# Patient Record
Sex: Female | Born: 1985 | Race: Black or African American | Hispanic: No | Marital: Single | Smoking: Former smoker
Health system: Southern US, Community
[De-identification: ages and names within clinical notes are randomized; demographics above are authoritative.]

## PROBLEM LIST (undated history)

## (undated) DIAGNOSIS — F1010 Alcohol abuse, uncomplicated: Secondary | ICD-10-CM

## (undated) DIAGNOSIS — F4310 Post-traumatic stress disorder, unspecified: Secondary | ICD-10-CM

## (undated) DIAGNOSIS — F419 Anxiety disorder, unspecified: Secondary | ICD-10-CM

## (undated) DIAGNOSIS — Z789 Other specified health status: Secondary | ICD-10-CM

---

## 2008-01-29 ENCOUNTER — Emergency Department (HOSPITAL_COMMUNITY): Admission: EM | Admit: 2008-01-29 | Discharge: 2008-01-29 | Payer: Self-pay | Admitting: Emergency Medicine

## 2008-02-04 ENCOUNTER — Emergency Department (HOSPITAL_COMMUNITY): Admission: EM | Admit: 2008-02-04 | Discharge: 2008-02-04 | Payer: Self-pay | Admitting: Emergency Medicine

## 2009-07-03 ENCOUNTER — Other Ambulatory Visit: Admission: RE | Admit: 2009-07-03 | Discharge: 2009-07-03 | Payer: Self-pay | Admitting: Obstetrics and Gynecology

## 2011-06-30 ENCOUNTER — Other Ambulatory Visit (HOSPITAL_COMMUNITY)
Admission: RE | Admit: 2011-06-30 | Discharge: 2011-06-30 | Disposition: A | Discharge status: Home / Self Care | Payer: 59 | Source: Ambulatory Visit | Attending: Obstetrics and Gynecology | Admitting: Obstetrics and Gynecology

## 2011-06-30 ENCOUNTER — Other Ambulatory Visit: Payer: Self-pay | Admitting: Nurse Practitioner

## 2011-06-30 DIAGNOSIS — Z01419 Encounter for gynecological examination (general) (routine) without abnormal findings: Secondary | ICD-10-CM | POA: Insufficient documentation

## 2012-02-08 ENCOUNTER — Ambulatory Visit (INDEPENDENT_AMBULATORY_CARE_PROVIDER_SITE_OTHER): Payer: BC Managed Care – PPO | Admitting: Emergency Medicine

## 2012-02-08 VITALS — BP 106/74 | HR 93 | Temp 98.6°F | Resp 16 | Ht 63.5 in | Wt 160.0 lb

## 2012-02-08 DIAGNOSIS — N760 Acute vaginitis: Secondary | ICD-10-CM

## 2012-02-08 DIAGNOSIS — A499 Bacterial infection, unspecified: Secondary | ICD-10-CM

## 2012-02-08 DIAGNOSIS — B9689 Other specified bacterial agents as the cause of diseases classified elsewhere: Secondary | ICD-10-CM

## 2012-02-08 NOTE — Progress Notes (Addendum)
   Date:  02/08/2012   Name:  Tara Yang   DOB:  28-May-1986   MRN:  [id number] Gender: female Age: 26 y.o.  PCP:  No primary provider on file.    Chief Complaint: Vaginal Itching   History of Present Illness:  Tara Yang is a 25 y.o. pleasant patient who presents with the following:  Had GYN annual last week and put on flagyl for BV.  Now to office with concern that she has "bugs"  Infesting her pubic area.  She has no itching and does have a discharge consistent with her history of BV.  No GI or GU symptoms.    There is no problem list on file for this patient.   No past medical history on file.  No past surgical history on file.  History  Substance Use Topics  . Smoking status: Current Some Day Smoker  . Smokeless tobacco: Not on file  . Alcohol Use: Not on file    No family history on file.  No Known Allergies  Medication list has been reviewed and updated.  No current outpatient prescriptions on file prior to visit.    Review of Systems:  As per HPI, otherwise negative.    Physical Examination: Filed Vitals:   02/08/12 1320  BP: 106/74  Pulse: 93  Temp: 98.6 F (37 C)  Resp: 16   Filed Vitals:   02/08/12 1320  Height: 5' 3.5" (1.613 m)  Weight: 160 lb (72.576 kg)   Body mass index is 27.90 kg/(m^2). Ideal Body Weight: Weight in (lb) to have BMI = 25: 143.1    GEN: WDWN, NAD, Non-toxic, Alert & Oriented x 3 HEENT: Atraumatic, Normocephalic.  Ears and Nose: No external deformity. EXTR: No clubbing/cyanosis/edema NEURO: Normal gait.  PSYCH: Normally interactive. Conversant. Not depressed or anxious appearing.  Calm demeanor.  Physical Examination: Pelvic - normal external genitalia, vulva, vagina, cervix, uterus and adnexa   Assessment and Plan: BV Continue flagyl Follow up as needed  Carmelina Dane, MD I have reviewed and agree with documentation. Robert P. Merla Riches, M.D.

## 2014-07-21 ENCOUNTER — Ambulatory Visit (INDEPENDENT_AMBULATORY_CARE_PROVIDER_SITE_OTHER): Payer: 59 | Admitting: Emergency Medicine

## 2014-07-21 VITALS — BP 102/70 | HR 74 | Temp 98.7°F | Resp 18 | Ht 62.0 in | Wt 182.0 lb

## 2014-07-21 DIAGNOSIS — J029 Acute pharyngitis, unspecified: Secondary | ICD-10-CM

## 2014-07-21 DIAGNOSIS — E669 Obesity, unspecified: Secondary | ICD-10-CM

## 2014-07-21 DIAGNOSIS — Z0000 Encounter for general adult medical examination without abnormal findings: Secondary | ICD-10-CM

## 2014-07-21 LAB — POCT URINALYSIS DIPSTICK
BILIRUBIN UA: NEGATIVE
Glucose, UA: NEGATIVE
Ketones, UA: NEGATIVE
LEUKOCYTES UA: NEGATIVE
NITRITE UA: NEGATIVE
PH UA: 6
PROTEIN UA: NEGATIVE
RBC UA: NEGATIVE
Spec Grav, UA: 1.01
UROBILINOGEN UA: 0.2

## 2014-07-21 LAB — COMPREHENSIVE METABOLIC PANEL
ALT: 13 U/L (ref 0–35)
AST: 18 U/L (ref 0–37)
Albumin: 4.3 g/dL (ref 3.5–5.2)
Alkaline Phosphatase: 46 U/L (ref 39–117)
BUN: 9 mg/dL (ref 6–23)
CHLORIDE: 103 meq/L (ref 96–112)
CO2: 24 meq/L (ref 19–32)
CREATININE: 0.66 mg/dL (ref 0.50–1.10)
Calcium: 9.2 mg/dL (ref 8.4–10.5)
Glucose, Bld: 77 mg/dL (ref 70–99)
Potassium: 3.7 mEq/L (ref 3.5–5.3)
SODIUM: 136 meq/L (ref 135–145)
Total Bilirubin: 0.7 mg/dL (ref 0.2–1.2)
Total Protein: 7.7 g/dL (ref 6.0–8.3)

## 2014-07-21 LAB — LIPID PANEL
CHOL/HDL RATIO: 2.4 ratio
CHOLESTEROL: 158 mg/dL (ref 0–200)
HDL: 65 mg/dL (ref >39)
LDL CALC: 82 mg/dL (ref 0–99)
Triglycerides: 56 mg/dL (ref <150)
VLDL: 11 mg/dL (ref 0–40)

## 2014-07-21 LAB — TSH: TSH: 0.762 u[IU]/mL (ref 0.350–4.500)

## 2014-07-21 LAB — POCT RAPID STREP A (OFFICE): RAPID STREP A SCREEN: NEGATIVE

## 2014-07-21 NOTE — Patient Instructions (Signed)

## 2014-07-21 NOTE — Progress Notes (Signed)
[facility] [address], [place] [id number] [phone]  Date:  07/21/2014   Name:  Tara LandauDaphne Yang   DOB:  09/26/1985   MRN:  [id number]  PCP:  No primary care provider on file.    Chief Complaint: Annual Exam and Sore Throat   History of Present Illness:  Tara Yang is a 29 y.o. very pleasant female patient who presents with the following:  Patient comes for a wellness examination Complains of a sore throat. No fever or chills No cough or coryza. No improvement with over the counter medications or other home remedies.  Denies other complaint or health concern today.   There are no active problems to display for this patient.   No past medical history on file.  No past surgical history on file.  History  Substance Use Topics  . Smoking status: Current Some Day Smoker  . Smokeless tobacco: Not on file  . Alcohol Use: Yes    No family history on file.  No Known Allergies  Medication list has been reviewed and updated.  No current outpatient prescriptions on file prior to visit.   No current facility-administered medications on file prior to visit.    Review of Systems:  As per HPI, otherwise negative.    Physical Examination: Filed Vitals:   07/21/14 1249  BP: 102/70  Pulse: 74  Temp: 98.7 F (37.1 C)  Resp: 18   Filed Vitals:   07/21/14 1249  Height: 5\' 2"  (1.575 m)  Weight: 182 lb (82.555 kg)   Body mass index is 33.28 kg/(m^2). Ideal Body Weight: Weight in (lb) to have BMI = 25: 136.4  GEN: WDWN, NAD, Non-toxic, A & O x 3 HEENT: Atraumatic, Normocephalic. Neck supple. No masses, No LAD. Ears and Nose: No external deformity. CV: RRR, No M/G/R. No JVD. No thrill. No extra heart sounds. PULM: CTA B, no wheezes, crackles, rhonchi. No retractions. No resp. distress. No accessory muscle use. ABD: S, NT, ND, +BS. No rebound. No HSM. EXTR: No c/c/e NEURO Normal gait.  PSYCH: Normally interactive. Conversant. Not  depressed or anxious appearing.  Calm demeanor.    Assessment and Plan: Annual exam Pharyngitis TBST  Signed,  Phillips OdorJeffery Ovid Witman, MD   Results for orders placed or performed in visit on 07/21/14  POCT urinalysis dipstick  Result Value Ref Range   Color, UA yellow    Clarity, UA clear    Glucose, UA neg    Bilirubin, UA neg    Ketones, UA neg    Spec Grav, UA 1.010    Blood, UA neg    pH, UA 6.0    Protein, UA neg    Urobilinogen, UA 0.2    Nitrite, UA neg    Leukocytes, UA Negative   POCT rapid strep A  Result Value Ref Range   Rapid Strep A Screen Negative Negative

## 2014-07-22 LAB — CBC WITH DIFFERENTIAL/PLATELET
Basophils Absolute: 0 10*3/uL (ref 0.0–0.1)
Basophils Relative: 0 % (ref 0–1)
EOS PCT: 1 % (ref 0–5)
Eosinophils Absolute: 0.1 10*3/uL (ref 0.0–0.7)
HCT: 37.4 % (ref 36.0–46.0)
HEMOGLOBIN: 12.4 g/dL (ref 12.0–15.0)
LYMPHS PCT: 33 % (ref 12–46)
Lymphs Abs: 2.3 10*3/uL (ref 0.7–4.0)
MCH: 30.7 pg (ref 26.0–34.0)
MCHC: 33.2 g/dL (ref 30.0–36.0)
MCV: 92.6 fL (ref 78.0–100.0)
MONO ABS: 0.4 10*3/uL (ref 0.1–1.0)
MONOS PCT: 5 % (ref 3–12)
MPV: 11.2 fL (ref 8.6–12.4)
NEUTROS ABS: 4.3 10*3/uL (ref 1.7–7.7)
Neutrophils Relative %: 61 % (ref 43–77)
Platelets: 251 10*3/uL (ref 150–400)
RBC: 4.04 MIL/uL (ref 3.87–5.11)
RDW: 13.2 % (ref 11.5–15.5)
WBC: 7 10*3/uL (ref 4.0–10.5)

## 2014-07-23 DIAGNOSIS — Z111 Encounter for screening for respiratory tuberculosis: Secondary | ICD-10-CM

## 2014-07-23 LAB — TB SKIN TEST
INDURATION: 0 mm
TB SKIN TEST: NEGATIVE

## 2014-07-27 ENCOUNTER — Ambulatory Visit (INDEPENDENT_AMBULATORY_CARE_PROVIDER_SITE_OTHER): Payer: 59 | Admitting: Emergency Medicine

## 2014-07-27 VITALS — BP 109/65 | HR 80 | Temp 98.6°F | Resp 17 | Ht 64.0 in | Wt 181.0 lb

## 2014-07-27 DIAGNOSIS — R059 Cough, unspecified: Secondary | ICD-10-CM

## 2014-07-27 DIAGNOSIS — R062 Wheezing: Secondary | ICD-10-CM

## 2014-07-27 DIAGNOSIS — R05 Cough: Secondary | ICD-10-CM

## 2014-07-27 MED ORDER — PREDNISONE 10 MG PO TABS: ORAL_TABLET | ORAL | Status: DC

## 2014-07-27 MED ORDER — BENZONATATE 100 MG PO CAPS: 100.0000 mg | ORAL_CAPSULE | Freq: Three times a day (TID) | ORAL | Status: DC | PRN

## 2014-07-27 MED ORDER — ALBUTEROL SULFATE (2.5 MG/3ML) 0.083% IN NEBU
2.5000 mg | INHALATION_SOLUTION | Freq: Once | RESPIRATORY_TRACT | Status: AC
  Administered 2014-07-27: 2.5 mg via RESPIRATORY_TRACT

## 2014-07-27 NOTE — Patient Instructions (Signed)
Cough, Adult  A cough is a reflex that helps clear your throat and airways. It can help heal the body or may be a reaction to an irritated airway. A cough may only last 2 or 3 weeks (acute) or may last more than 8 weeks (chronic).  CAUSES Acute cough:  Viral or bacterial infections. Chronic cough:  Infections.  Allergies.  Asthma.  Post-nasal drip.  Smoking.  Heartburn or acid reflux.  Some medicines.  Chronic lung problems (COPD).  Cancer. SYMPTOMS   Cough.  Fever.  Chest pain.  Increased breathing rate.  High-pitched whistling sound when breathing (wheezing).  Colored mucus that you cough up (sputum). TREATMENT   A bacterial cough may be treated with antibiotic medicine.  A viral cough must run its course and will not respond to antibiotics.  Your caregiver may recommend other treatments if you have a chronic cough. HOME CARE INSTRUCTIONS   Only take over-the-counter or prescription medicines for pain, discomfort, or fever as directed by your caregiver. Use cough suppressants only as directed by your caregiver.  Use a cold steam vaporizer or humidifier in your bedroom or home to help loosen secretions.  Sleep in a semi-upright position if your cough is worse at night.  Rest as needed.  Stop smoking if you smoke. SEEK IMMEDIATE MEDICAL CARE IF:   You have pus in your sputum.  Your cough starts to worsen.  You cannot control your cough with suppressants and are losing sleep.  You begin coughing up blood.  You have difficulty breathing.  You develop pain which is getting worse or is uncontrolled with medicine.  You have a fever. MAKE SURE YOU:   Understand these instructions.  Will watch your condition.  Will get help right away if you are not doing well or get worse. Document Released: 11/14/2010 Document Revised: 08/10/2011 Document Reviewed: 11/14/2010 ExitCare Patient Information 2015 [employer]. This information is not intended  to replace advice given to you by your health care provider. Make sure you discuss any questions you have with your health care provider. Allergic Rhinitis Allergic rhinitis is when the mucous membranes in the nose respond to allergens. Allergens are particles in the air that cause your body to have an allergic reaction. This causes you to release allergic antibodies. Through a chain of events, these eventually cause you to release histamine into the blood stream. Although meant to protect the body, it is this release of histamine that causes your discomfort, such as frequent sneezing, congestion, and an itchy, runny nose.  CAUSES  Seasonal allergic rhinitis (hay fever) is caused by pollen allergens that may come from grasses, trees, and weeds. Year-round allergic rhinitis (perennial allergic rhinitis) is caused by allergens such as house dust mites, pet dander, and mold spores.  SYMPTOMS   Nasal stuffiness (congestion).  Itchy, runny nose with sneezing and tearing of the eyes. DIAGNOSIS  Your health care provider can help you determine the allergen or allergens that trigger your symptoms. If you and your health care provider are unable to determine the allergen, skin or blood testing may be used. TREATMENT  Allergic rhinitis does not have a cure, but it can be controlled by:  Medicines and allergy shots (immunotherapy).  Avoiding the allergen. Hay fever may often be treated with antihistamines in pill or nasal spray forms. Antihistamines block the effects of histamine. There are over-the-counter medicines that may help with nasal congestion and swelling around the eyes. Check with your health care provider before taking or   giving this medicine.  If avoiding the allergen or the medicine prescribed do not work, there are many new medicines your health care provider can prescribe. Stronger medicine may be used if initial measures are ineffective. Desensitizing injections can be used if medicine and  avoidance does not work. Desensitization is when a patient is given ongoing shots until the body becomes less sensitive to the allergen. Make sure you follow up with your health care provider if problems continue. HOME CARE INSTRUCTIONS It is not possible to completely avoid allergens, but you can reduce your symptoms by taking steps to limit your exposure to them. It helps to know exactly what you are allergic to so that you can avoid your specific triggers. SEEK MEDICAL CARE IF:   You have a fever.  You develop a cough that does not stop easily (persistent).  You have shortness of breath.  You start wheezing.  Symptoms interfere with normal daily activities. Document Released: 02/10/2001 Document Revised: 05/23/2013 Document Reviewed: 01/23/2013 ExitCare Patient Information 2015 [employer]. This information is not intended to replace advice given to you by your health care provider. Make sure you discuss any questions you have with your health care provider.  

## 2014-07-27 NOTE — Progress Notes (Signed)
   Subjective:    Patient ID: Tara Yang, female    DOB: 02/05/1986, 29 y.o.   MRN: [id number]  This chart was scribed for Collene GobbleSteven A Daub, MD by Ronney LionSuzanne Le, ED Scribe. This patient was seen in room  and the patient's care was started at 2:21 PM.   Chief Complaint  Patient presents with  . Cough  . URI    HPI  HPI Comments: Tara LandauDaphne Leanos is a 29 y.o. female who presents to the [facility] complaining of a constant, severe cough that began 4 days ago after being seen here for a sore throat 6 days ago. She had a strep test ordered by Dr. Dareen PianoAnderson that was negative. Patient today complains of associated nasal congestion, rhinorrhea, and subjective fever, although she notes she has not taken her temperature. She has taken Mucinex DM with some relief. She denies a history of asthma, stating that she has not had to use an inhaler. Patient denies any known sick contact. She denies smoking. She denies SOB.    Review of Systems  Constitutional: Positive for fever.  HENT: Positive for congestion and rhinorrhea.   Respiratory: Positive for cough. Negative for shortness of breath.        Objective:   Physical Exam  Nursing note and vitals reviewed.  CONSTITUTIONAL: Well developed/well nourished HEAD: Normocephalic/atraumatic EYES: EOMI/PERRL ENMT: Ears are normal; nasal turbinates are blue-colored and congested NECK: supple no meningeal signs SPINE/BACK:entire spine nontender CV: S1/S2 noted, no murmurs/rubs/gallops noted LUNGS: Mild prolongation of expiration with no wheezes or rales ABDOMEN: soft, nontender, no rebound or guarding, bowel sounds noted throughout abdomen GU:no cva tenderness NEURO: Pt is awake/alert/appropriate, moves all extremitiesx4.  No facial droop.   EXTREMITIES: pulses normal/equal, full ROM SKIN: warm, color normal PSYCH: no abnormalities of mood noted, alert and oriented to situation     Assessment & Plan:    this is most likely allergic  rhinitis with reactive airways disease. Will treat with Zyrtec one a day along with Tessalon Perles and a tapered dose of prednisone.I personally performed the services described in this documentation, which was scribed in my presence. The recorded information has been reviewed and is accurate.

## 2014-07-27 NOTE — Progress Notes (Deleted)
   Subjective:    Patient ID: Tara Yang, female    DOB: 12/01/1985, 29 y.o.   MRN: [id number]  HPI    Review of Systems     Objective:   Physical Exam        Assessment & Plan:

## 2014-08-07 ENCOUNTER — Other Ambulatory Visit (HOSPITAL_COMMUNITY)
Admission: RE | Admit: 2014-08-07 | Discharge: 2014-08-07 | Disposition: A | Discharge status: Home / Self Care | Payer: Managed Care, Other (non HMO) | Source: Ambulatory Visit | Attending: Nurse Practitioner | Admitting: Nurse Practitioner

## 2014-08-07 ENCOUNTER — Other Ambulatory Visit: Payer: Self-pay | Admitting: Nurse Practitioner

## 2014-08-07 DIAGNOSIS — Z01419 Encounter for gynecological examination (general) (routine) without abnormal findings: Secondary | ICD-10-CM | POA: Diagnosis present

## 2014-08-07 DIAGNOSIS — Z113 Encounter for screening for infections with a predominantly sexual mode of transmission: Secondary | ICD-10-CM | POA: Insufficient documentation

## 2014-08-08 LAB — CYTOLOGY - PAP

## 2015-07-22 ENCOUNTER — Ambulatory Visit (INDEPENDENT_AMBULATORY_CARE_PROVIDER_SITE_OTHER): Payer: 59 | Admitting: Family Medicine

## 2015-07-22 ENCOUNTER — Ambulatory Visit (INDEPENDENT_AMBULATORY_CARE_PROVIDER_SITE_OTHER): Payer: 59

## 2015-07-22 VITALS — BP 104/82 | HR 75 | Temp 98.4°F | Resp 16 | Ht 64.5 in | Wt 189.0 lb

## 2015-07-22 DIAGNOSIS — Z111 Encounter for screening for respiratory tuberculosis: Secondary | ICD-10-CM

## 2015-07-22 DIAGNOSIS — J302 Other seasonal allergic rhinitis: Secondary | ICD-10-CM | POA: Diagnosis not present

## 2015-07-22 MED ORDER — LEVOCETIRIZINE DIHYDROCHLORIDE 5 MG PO TABS: 5.0000 mg | ORAL_TABLET | Freq: Every evening | ORAL | Status: DC

## 2015-07-22 MED ORDER — MOMETASONE FUROATE 50 MCG/ACT NA SUSP: NASAL | Status: DC

## 2015-07-22 NOTE — Progress Notes (Signed)
Patient ID: Tara Yang, female    DOB: December 30, 1985  Age: 30 y.o. MRN: [id number]  Chief Complaint  Patient presents with  . Other    wants xray for tb, employment  . 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 Apparently she is going to be out of town and cannot return for the reading of a skin test. Therefore she is desiring a chest x-ray. She also is been having a lot of trouble with her allergies. Primarily upper respiratory nasal allergy. A pseudoephedrine of her sisters helped her a lot. She doesn't like over-the-counter antihistamines. Wants to try some different things.  Current allergies, medications, problem list, past/family and social histories reviewed.  Objective:  BP 104/82 mmHg  Pulse 75  Temp(Src) 98.4 F (36.9 C)  Resp 16  Ht 5' 4.5" (1.638 m)  Wt 189 lb (85.73 kg)  BMI 31.95 kg/m2  SpO2 98%  LMP 07/22/2015  No major distress. TMs are normal. Nose congested. Throat clear. Neck supple without nodes. Chest is clear to auscultation. Heart regular without any murmurs.  Assessment & Plan:   Assessment: 1. Seasonal allergies   2. Screening-pulmonary TB       Plan: Allergic rhinitis Need for TB clearance will get a single view chest x-ray. She has a nexplenon but is not sexually involved. Denies any chance of pregnancy.  Orders Placed This Encounter  Procedures  . DG Chest 1 View    Order Specific Question:  Reason for Exam (SYMPTOM  OR DIAGNOSIS REQUIRED)    Answer:  allergies;  required to have tb clearance for employment    Order Specific Question:  Is the patient pregnant?    Answer:  No    Order Specific Question:  Preferred imaging location?    Answer:  External    Meds ordered this encounter  Medications  . levocetirizine (XYZAL) 5 MG tablet    Sig: Take 1 tablet (5 mg total) by mouth every evening.    Dispense:  30 tablet    Refill:  5  .  mometasone (NASONEX) 50 MCG/ACT nasal spray    Sig: Use 2 sprays each nostril daily for allergies    Dispense:  17 g    Refill:  12    Chest xray normal. Letter written.  Patient Instructions  Use the mometasone nose spray 2 sprays each nostril once daily to treat the lining of the nose for allergies  Try the Xyzal 1 daily for allergies. (If this has to high co-pay, you may still wish to use an over-the-counter Claritin, Allegra, or Zyrtec (loratadine, fexofenadine, or cetirizine (generics) )).  You can try Sudafed (pseudoephedrine) that you have to ask the pharmacist for. This is just a decongestant, and opens up your head, but does not treat allergies actually.  Return as needed  Allergic Rhinitis Allergic rhinitis is when the mucous membranes in the nose respond to allergens. Allergens are particles in the air that cause your body to have an allergic reaction. This causes you to release allergic antibodies. Through a chain of events, these eventually cause you to release histamine into the blood stream. Although meant to protect the body, it is this release of histamine that causes your discomfort, such as frequent sneezing, congestion, and an itchy, runny nose.  CAUSES Seasonal allergic rhinitis (hay fever) is caused by pollen allergens that may come from grasses, trees,  and weeds. Year-round allergic rhinitis (perennial allergic rhinitis) is caused by allergens such as house dust mites, pet dander, and mold spores. SYMPTOMS  Nasal stuffiness (congestion).  Itchy, runny nose with sneezing and tearing of the eyes. DIAGNOSIS Your health care provider can help you determine the allergen or allergens that trigger your symptoms. If you and your health care provider are unable to determine the allergen, skin or blood testing may be used. Your health care provider will diagnose your condition after taking your health history and performing a physical exam. Your health care provider may assess  you for other related conditions, such as asthma, pink eye, or an ear infection. TREATMENT Allergic rhinitis does not have a cure, but it can be controlled by:  Medicines that block allergy symptoms. These may include allergy shots, nasal sprays, and oral antihistamines.  Avoiding the allergen. Hay fever may often be treated with antihistamines in pill or nasal spray forms. Antihistamines block the effects of histamine. There are over-the-counter medicines that may help with nasal congestion and swelling around the eyes. Check with your health care provider before taking or giving this medicine. If avoiding the allergen or the medicine prescribed do not work, there are many new medicines your health care provider can prescribe. Stronger medicine may be used if initial measures are ineffective. Desensitizing injections can be used if medicine and avoidance does not work. Desensitization is when a patient is given ongoing shots until the body becomes less sensitive to the allergen. Make sure you follow up with your health care provider if problems continue. HOME CARE INSTRUCTIONS It is not possible to completely avoid allergens, but you can reduce your symptoms by taking steps to limit your exposure to them. It helps to know exactly what you are allergic to so that you can avoid your specific triggers. SEEK MEDICAL CARE IF:  You have a fever.  You develop a cough that does not stop easily (persistent).  You have shortness of breath.  You start wheezing.  Symptoms interfere with normal daily activities.   This information is not intended to replace advice given to you by your health care provider. Make sure you discuss any questions you have with your health care provider.   Document Released: 02/10/2001 Document Revised: 06/08/2014 Document Reviewed: 01/23/2013 Elsevier Interactive Patient Education [employer]      Return if symptoms worsen or fail to  improve.   HOPPER,DAVID, MD 07/22/2015

## 2015-07-22 NOTE — Patient Instructions (Signed)
Use the mometasone nose spray 2 sprays each nostril once daily to treat the lining of the nose for allergies  Try the Xyzal 1 daily for allergies. (If this has to high co-pay, you may still wish to use an over-the-counter Claritin, Allegra, or Zyrtec (loratadine, fexofenadine, or cetirizine (generics) )).  You can try Sudafed (pseudoephedrine) that you have to ask the pharmacist for. This is just a decongestant, and opens up your head, but does not treat allergies actually.  Return as needed  Allergic Rhinitis Allergic rhinitis is when the mucous membranes in the nose respond to allergens. Allergens are particles in the air that cause your body to have an allergic reaction. This causes you to release allergic antibodies. Through a chain of events, these eventually cause you to release histamine into the blood stream. Although meant to protect the body, it is this release of histamine that causes your discomfort, such as frequent sneezing, congestion, and an itchy, runny nose.  CAUSES Seasonal allergic rhinitis (hay fever) is caused by pollen allergens that may come from grasses, trees, and weeds. Year-round allergic rhinitis (perennial allergic rhinitis) is caused by allergens such as house dust mites, pet dander, and mold spores. SYMPTOMS  Nasal stuffiness (congestion).  Itchy, runny nose with sneezing and tearing of the eyes. DIAGNOSIS Your health care provider can help you determine the allergen or allergens that trigger your symptoms. If you and your health care provider are unable to determine the allergen, skin or blood testing may be used. Your health care provider will diagnose your condition after taking your health history and performing a physical exam. Your health care provider may assess you for other related conditions, such as asthma, pink eye, or an ear infection. TREATMENT Allergic rhinitis does not have a cure, but it can be controlled by:  Medicines that block allergy  symptoms. These may include allergy shots, nasal sprays, and oral antihistamines.  Avoiding the allergen. Hay fever may often be treated with antihistamines in pill or nasal spray forms. Antihistamines block the effects of histamine. There are over-the-counter medicines that may help with nasal congestion and swelling around the eyes. Check with your health care provider before taking or giving this medicine. If avoiding the allergen or the medicine prescribed do not work, there are many new medicines your health care provider can prescribe. Stronger medicine may be used if initial measures are ineffective. Desensitizing injections can be used if medicine and avoidance does not work. Desensitization is when a patient is given ongoing shots until the body becomes less sensitive to the allergen. Make sure you follow up with your health care provider if problems continue. HOME CARE INSTRUCTIONS It is not possible to completely avoid allergens, but you can reduce your symptoms by taking steps to limit your exposure to them. It helps to know exactly what you are allergic to so that you can avoid your specific triggers. SEEK MEDICAL CARE IF:  You have a fever.  You develop a cough that does not stop easily (persistent).  You have shortness of breath.  You start wheezing.  Symptoms interfere with normal daily activities.   This information is not intended to replace advice given to you by your health care provider. Make sure you discuss any questions you have with your health care provider.   Document Released: 02/10/2001 Document Revised: 06/08/2014 Document Reviewed: 01/23/2013 Elsevier Interactive Patient Education [employer]

## 2015-07-26 ENCOUNTER — Encounter (HOSPITAL_COMMUNITY): Payer: Self-pay | Admitting: Oncology

## 2015-07-26 ENCOUNTER — Emergency Department (HOSPITAL_COMMUNITY): Payer: Managed Care, Other (non HMO)

## 2015-07-26 ENCOUNTER — Emergency Department (HOSPITAL_COMMUNITY)
Admission: EM | Admit: 2015-07-26 | Discharge: 2015-07-27 | Disposition: A | Discharge status: Other Acute Inpatient Hospital | Payer: 59 | Attending: Emergency Medicine | Admitting: Emergency Medicine

## 2015-07-26 DIAGNOSIS — Z3202 Encounter for pregnancy test, result negative: Secondary | ICD-10-CM | POA: Insufficient documentation

## 2015-07-26 DIAGNOSIS — Z79899 Other long term (current) drug therapy: Secondary | ICD-10-CM | POA: Insufficient documentation

## 2015-07-26 DIAGNOSIS — Z7952 Long term (current) use of systemic steroids: Secondary | ICD-10-CM | POA: Insufficient documentation

## 2015-07-26 DIAGNOSIS — Z87891 Personal history of nicotine dependence: Secondary | ICD-10-CM | POA: Insufficient documentation

## 2015-07-26 DIAGNOSIS — F23 Brief psychotic disorder: Secondary | ICD-10-CM | POA: Diagnosis present

## 2015-07-26 DIAGNOSIS — G4700 Insomnia, unspecified: Secondary | ICD-10-CM | POA: Diagnosis present

## 2015-07-26 LAB — URINE MICROSCOPIC-ADD ON
SQUAMOUS EPITHELIAL / LPF: NONE SEEN
WBC UA: NONE SEEN WBC/hpf (ref 0–5)

## 2015-07-26 LAB — CBC
HEMATOCRIT: 38.7 % (ref 36.0–46.0)
Hemoglobin: 13.6 g/dL (ref 12.0–15.0)
MCH: 31.4 pg (ref 26.0–34.0)
MCHC: 35.1 g/dL (ref 30.0–36.0)
MCV: 89.4 fL (ref 78.0–100.0)
PLATELETS: 138 10*3/uL — AB (ref 150–400)
RBC: 4.33 MIL/uL (ref 3.87–5.11)
RDW: 12.3 % (ref 11.5–15.5)
WBC: 3.4 10*3/uL — AB (ref 4.0–10.5)

## 2015-07-26 LAB — COMPREHENSIVE METABOLIC PANEL
ALBUMIN: 4.9 g/dL (ref 3.5–5.0)
ALK PHOS: 47 U/L (ref 38–126)
ALT: 20 U/L (ref 14–54)
AST: 30 U/L (ref 15–41)
Anion gap: 16 — ABNORMAL HIGH (ref 5–15)
BILIRUBIN TOTAL: 0.5 mg/dL (ref 0.3–1.2)
BUN: 8 mg/dL (ref 6–20)
CHLORIDE: 101 mmol/L (ref 101–111)
CO2: 20 mmol/L — ABNORMAL LOW (ref 22–32)
CREATININE: 0.76 mg/dL (ref 0.44–1.00)
Calcium: 9.5 mg/dL (ref 8.9–10.3)
Glucose, Bld: 101 mg/dL — ABNORMAL HIGH (ref 65–99)
POTASSIUM: 3.5 mmol/L (ref 3.5–5.1)
SODIUM: 137 mmol/L (ref 135–145)
TOTAL PROTEIN: 8.9 g/dL — AB (ref 6.5–8.1)

## 2015-07-26 LAB — URINALYSIS, ROUTINE W REFLEX MICROSCOPIC
Bilirubin Urine: NEGATIVE
Glucose, UA: NEGATIVE mg/dL
Ketones, ur: 40 mg/dL — AB
LEUKOCYTES UA: NEGATIVE
Nitrite: NEGATIVE
PROTEIN: 100 mg/dL — AB
SPECIFIC GRAVITY, URINE: 1.028 (ref 1.005–1.030)
pH: 6 (ref 5.0–8.0)

## 2015-07-26 LAB — I-STAT BETA HCG BLOOD, ED (MC, WL, AP ONLY)

## 2015-07-26 LAB — RAPID URINE DRUG SCREEN, HOSP PERFORMED
Amphetamines: NOT DETECTED
BENZODIAZEPINES: NOT DETECTED
Barbiturates: NOT DETECTED
Cocaine: NOT DETECTED
Opiates: NOT DETECTED
Tetrahydrocannabinol: NOT DETECTED

## 2015-07-26 LAB — ETHANOL

## 2015-07-26 LAB — CBG MONITORING, ED: Glucose-Capillary: 106 mg/dL — ABNORMAL HIGH (ref 65–99)

## 2015-07-26 MED ORDER — ACETAMINOPHEN 325 MG PO TABS: 650.0000 mg | ORAL_TABLET | ORAL | Status: DC | PRN

## 2015-07-26 MED ORDER — ONDANSETRON HCL 4 MG PO TABS: 4.0000 mg | ORAL_TABLET | Freq: Three times a day (TID) | ORAL | Status: DC | PRN

## 2015-07-26 MED ORDER — ALUM & MAG HYDROXIDE-SIMETH 200-200-20 MG/5ML PO SUSP: 30.0000 mL | ORAL | Status: DC | PRN

## 2015-07-26 MED ORDER — LORAZEPAM 2 MG/ML IJ SOLN
1.0000 mg | Freq: Once | INTRAMUSCULAR | Status: AC
  Administered 2015-07-26: 1 mg via INTRAMUSCULAR
  Filled 2015-07-26: qty 1

## 2015-07-26 MED ORDER — LORAZEPAM 1 MG PO TABS
1.0000 mg | ORAL_TABLET | Freq: Three times a day (TID) | ORAL | Status: DC | PRN
Start: 2015-07-26 — End: 2015-07-27
  Administered 2015-07-27: 1 mg via ORAL
  Filled 2015-07-26 (×2): qty 1

## 2015-07-26 MED ORDER — NICOTINE 21 MG/24HR TD PT24: 21.0000 mg | MEDICATED_PATCH | Freq: Every day | TRANSDERMAL | Status: DC

## 2015-07-26 MED ORDER — IBUPROFEN 200 MG PO TABS: 600.0000 mg | ORAL_TABLET | Freq: Three times a day (TID) | ORAL | Status: DC | PRN

## 2015-07-26 NOTE — ED Provider Notes (Signed)
CSN: [id number]     Arrival date & time 07/26/15  1955 History   First MD Initiated Contact with Patient 07/26/15 2044     Chief Complaint  Patient presents with  . Altered Mental Status     (Consider location/radiation/quality/duration/timing/severity/associated sxs/prior Treatment) HPI Comments: Patient here with acute onset of confusion which began prior to arrival. Patient called her parents and told the mesh needed to go to the hospital. She has started a new allergy medication recently. Denies any fever, neck pain, vomiting. No illicit drug use. No alcohol use. Mother states that patient has been under greater stress recently and did leave her job early. No prior history of psychiatric disorder. No recent illnesses. Symptoms have been persistent and nothing makes them worse. No treatment used prior to arrival  Patient is a 30 y.o. female presenting with altered mental status. The history is provided by the patient, a relative and a parent.  Altered Mental Status   History reviewed. No pertinent past medical history. History reviewed. No pertinent past surgical history. No family history on file. Social History  Substance Use Topics  . Smoking status: Former Games developer  . Smokeless tobacco: None  . Alcohol Use: 0.0 oz/week    0 Standard drinks or equivalent per week   OB History    No data available     Review of Systems  All other systems reviewed and are negative.     Allergies  Review of patient's allergies indicates no known allergies.  Home Medications   Prior to Admission medications   Medication Sig Start Date End Date Taking? Authorizing Provider  etonogestrel (NEXPLANON) 68 MG IMPL implant 1 each by Subdermal route once.    Historical Provider, MD  levocetirizine (XYZAL) 5 MG tablet Take 1 tablet (5 mg total) by mouth every evening. 07/22/15   Peyton Najjar, MD  mometasone (NASONEX) 50 MCG/ACT nasal spray Use 2 sprays each nostril daily for allergies 07/22/15    Peyton Najjar, MD   BP 134/88 mmHg  Pulse 87  Temp(Src) 98.3 F (36.8 C) (Oral)  Resp 20  SpO2 99%  LMP 07/22/2015 Physical Exam  Constitutional: She is oriented to person, place, and time. She appears well-developed and well-nourished.  Non-toxic appearance. No distress.  HENT:  Head: Normocephalic and atraumatic.  Eyes: Conjunctivae, EOM and lids are normal. Pupils are equal, round, and reactive to light.  Neck: Normal range of motion. Neck supple. No tracheal deviation present. No thyroid mass present.  Cardiovascular: Normal rate, regular rhythm and normal heart sounds.  Exam reveals no gallop.   No murmur heard. Pulmonary/Chest: Effort normal and breath sounds normal. No stridor. No respiratory distress. She has no decreased breath sounds. She has no wheezes. She has no rhonchi. She has no rales.  Abdominal: Soft. Normal appearance and bowel sounds are normal. She exhibits no distension. There is no tenderness. There is no rebound and no CVA tenderness.  Musculoskeletal: Normal range of motion. She exhibits no edema or tenderness.  Neurological: She is alert and oriented to person, place, and time. She has normal strength. No cranial nerve deficit or sensory deficit. Gait normal. GCS eye subscore is 4. GCS verbal subscore is 5. GCS motor subscore is 6.  Speech is somewhat delayed when answering questions but does answer questions appropriately  Skin: Skin is warm and dry. No abrasion and no rash noted.  Psychiatric: Her affect is blunt. Her speech is delayed. She is withdrawn. She expresses no suicidal ideation. She  expresses no suicidal plans and no homicidal plans.  Nursing note and vitals reviewed.   ED Course  Procedures (including critical care time) Labs Review Labs Reviewed  COMPREHENSIVE METABOLIC PANEL  CBC  ETHANOL  URINE RAPID DRUG SCREEN, HOSP PERFORMED  URINALYSIS, ROUTINE W REFLEX MICROSCOPIC (NOT AT [facility])  CBG MONITORING, ED  I-STAT BETA HCG BLOOD, ED ([facility],  [facility], [facility] ONLY)    Imaging Review No results found. I have personally reviewed and evaluated these images and lab results as part of my medical decision-making.   EKG Interpretation None      MDM   Final diagnoses:  None    Patient given Ativan here and reassess and now is doing much better. She is cooperative and explains that she has not slept well all week and has also been over 24 hours. Repeat neurological exam shows no focality. Her speech is more clear at this time. Head CT negative. She endorses increased stress due to her current job situation. We'll have psych see    Lorre Nick, MD 07/26/15 2322

## 2015-07-26 NOTE — ED Notes (Signed)
Delay in labs until pt gets ativan

## 2015-07-26 NOTE — ED Notes (Signed)
Addendum to triage note: Pt was Rx'd Levocetirizine (XYZAL) 5 MG tablet not levothyroxine.

## 2015-07-26 NOTE — ED Notes (Signed)
Pt has a rx for levothyroxine that was filled on the 20 th.  All pills accounted for.  Pt is confused in triage.  Per pt's sister pt called and was not making sense prompting her to bring pt in.  Pt has delayed responses.

## 2015-07-27 ENCOUNTER — Inpatient Hospital Stay (HOSPITAL_COMMUNITY)
Admission: AD | Admit: 2015-07-27 | Discharge: 2015-08-01 | DRG: 885 | Disposition: A | Discharge status: Home / Self Care | Payer: 59 | Source: Intra-hospital | Attending: Psychiatry | Admitting: Psychiatry

## 2015-07-27 ENCOUNTER — Encounter (HOSPITAL_COMMUNITY): Payer: Self-pay

## 2015-07-27 DIAGNOSIS — F1010 Alcohol abuse, uncomplicated: Secondary | ICD-10-CM | POA: Clinically undetermined

## 2015-07-27 DIAGNOSIS — F1020 Alcohol dependence, uncomplicated: Secondary | ICD-10-CM | POA: Diagnosis present

## 2015-07-27 DIAGNOSIS — F29 Unspecified psychosis not due to a substance or known physiological condition: Secondary | ICD-10-CM | POA: Diagnosis not present

## 2015-07-27 DIAGNOSIS — F4310 Post-traumatic stress disorder, unspecified: Secondary | ICD-10-CM | POA: Diagnosis present

## 2015-07-27 DIAGNOSIS — R45851 Suicidal ideations: Secondary | ICD-10-CM | POA: Diagnosis present

## 2015-07-27 DIAGNOSIS — F323 Major depressive disorder, single episode, severe with psychotic features: Secondary | ICD-10-CM | POA: Diagnosis present

## 2015-07-27 DIAGNOSIS — Z87891 Personal history of nicotine dependence: Secondary | ICD-10-CM | POA: Diagnosis not present

## 2015-07-27 DIAGNOSIS — G4700 Insomnia, unspecified: Secondary | ICD-10-CM | POA: Diagnosis present

## 2015-07-27 DIAGNOSIS — F329 Major depressive disorder, single episode, unspecified: Secondary | ICD-10-CM | POA: Diagnosis present

## 2015-07-27 DIAGNOSIS — F23 Brief psychotic disorder: Secondary | ICD-10-CM | POA: Diagnosis present

## 2015-07-27 DIAGNOSIS — F32A Depression, unspecified: Secondary | ICD-10-CM | POA: Diagnosis present

## 2015-07-27 LAB — PREGNANCY, URINE: PREG TEST UR: NEGATIVE

## 2015-07-27 LAB — TSH: TSH: 1.324 u[IU]/mL (ref 0.350–4.500)

## 2015-07-27 LAB — RAPID HIV SCREEN (HIV 1/2 AB+AG)
HIV 1/2 ANTIBODIES: NONREACTIVE
HIV-1 P24 ANTIGEN - HIV24: NONREACTIVE

## 2015-07-27 LAB — T4, FREE: Free T4: 0.94 ng/dL (ref 0.61–1.12)

## 2015-07-27 MED ORDER — DIPHENHYDRAMINE HCL 50 MG/ML IJ SOLN
25.0000 mg | Freq: Once | INTRAMUSCULAR | Status: DC
  Filled 2015-07-27: qty 0.5

## 2015-07-27 MED ORDER — DIPHENHYDRAMINE HCL 50 MG/ML IJ SOLN
INTRAMUSCULAR | Status: AC
  Filled 2015-07-27: qty 1

## 2015-07-27 MED ORDER — ACETAMINOPHEN 325 MG PO TABS
650.0000 mg | ORAL_TABLET | Freq: Four times a day (QID) | ORAL | Status: DC | PRN
  Administered 2015-07-28 – 2015-07-31 (×4): 650 mg via ORAL
  Filled 2015-07-27 (×3): qty 2

## 2015-07-27 MED ORDER — LORAZEPAM 2 MG/ML IJ SOLN: 1.0000 mg | Freq: Once | INTRAMUSCULAR | Status: DC

## 2015-07-27 MED ORDER — WHITE PETROLATUM GEL
Status: DC | PRN
  Filled 2015-07-27: qty 28.35

## 2015-07-27 MED ORDER — LORAZEPAM 2 MG/ML IJ SOLN
INTRAMUSCULAR | Status: AC
  Filled 2015-07-27: qty 1

## 2015-07-27 MED ORDER — PROPRANOLOL HCL 10 MG PO TABS
10.0000 mg | ORAL_TABLET | Freq: Once | ORAL | Status: AC
Start: 2015-07-27 — End: 2015-07-27
  Administered 2015-07-27: 10 mg via ORAL
  Filled 2015-07-27: qty 1

## 2015-07-27 MED ORDER — MAGNESIUM HYDROXIDE 400 MG/5ML PO SUSP: 30.0000 mL | Freq: Every day | ORAL | Status: DC | PRN

## 2015-07-27 MED ORDER — OLANZAPINE 10 MG PO TBDP: 10.0000 mg | ORAL_TABLET | Freq: Three times a day (TID) | ORAL | Status: DC | PRN

## 2015-07-27 MED ORDER — ALUM & MAG HYDROXIDE-SIMETH 200-200-20 MG/5ML PO SUSP: 30.0000 mL | ORAL | Status: DC | PRN

## 2015-07-27 MED ORDER — OLANZAPINE 10 MG PO TBDP
10.0000 mg | ORAL_TABLET | ORAL | Status: AC
  Administered 2015-07-27: 10 mg via ORAL
  Filled 2015-07-27 (×2): qty 1

## 2015-07-27 MED ORDER — ZIPRASIDONE MESYLATE 20 MG IM SOLR
10.0000 mg | Freq: Once | INTRAMUSCULAR | Status: DC
  Filled 2015-07-27 (×2): qty 20

## 2015-07-27 MED ORDER — ZIPRASIDONE MESYLATE 20 MG IM SOLR
INTRAMUSCULAR | Status: AC
  Filled 2015-07-27: qty 20

## 2015-07-27 MED ORDER — LORAZEPAM 1 MG PO TABS
1.0000 mg | ORAL_TABLET | ORAL | Status: AC | PRN
  Administered 2015-07-28: 1 mg via ORAL
  Filled 2015-07-27: qty 1

## 2015-07-27 NOTE — ED Notes (Signed)
Pt. Noted sleeping in room. No complaints or concerns voiced. No distress or abnormal behavior noted. Will continue to monitor with security cameras. Q 15 minute rounds continue. 

## 2015-07-27 NOTE — ED Notes (Signed)
Pt. To [facility] from ED ambulatory without difficulty, to room 36 . Report from [facility]. Pt. Is alert and oriented to place and person, warm and dry in no acute distress. Pt. Denies SI, HI, and AVH. Pt. Calm and cooperative. Pt. Made aware of security cameras and Q15 minute rounds. Pt. Encouraged to let Nursing staff know of any concerns or needs.

## 2015-07-27 NOTE — ED Notes (Signed)
Up to the bathroom, much calmer, ,more appropriate

## 2015-07-27 NOTE — BH Assessment (Signed)
Per Chyrl Civatte, [facility] pt [facility] bed assignment has changed to 502 (1).

## 2015-07-27 NOTE — Progress Notes (Signed)
D: Patient is resting in bed with eyes closed. No sign or symptoms of distress.  A: 1:1 staff remains with pt for safety.  R: Pt remains safe on the unit.   

## 2015-07-27 NOTE — ED Notes (Signed)
tts into see, mom at bedside.

## 2015-07-27 NOTE — ED Notes (Signed)
Up in hall, pt not comfortable in the day room because of the TV, reasurred, calmer, but still fearful

## 2015-07-27 NOTE — ED Notes (Signed)
Lab into draw, pt tolerated well, remained calm

## 2015-07-27 NOTE — Consult Note (Signed)
[facility] Consult   Reason for Consult:  Psychosis Referring Physician:  EDP Patient Identification: Hanne Kegg MRN:  [id number] Principal Diagnosis: Acute psychosis Diagnosis:   Patient Active Problem List   Diagnosis Date Noted  . Acute psychosis [F29] 07/27/2015    Priority: High  . Insomnia [G47.00] 07/27/2015    Priority: High    Total Time spent with patient: 45 minutes  Subjective:   Marena Witts is a 30 y.o. female patient admitted with psychosis.  HPI:  30 yo female who presented to the ED with paranoia and confusion, no prior psychiatric history or drug/alcohol abuse.  She is a high functioning individual in the workforce who became suddenly confused with bizarre behaviors.  Saraiyah reports she started allergy medicine but keeps calling it levothyroxine on Wednesday night and feels this may be contributing to her confusion.  Thought blocking and circumstantial on assessment.  She is trying to justify her confusion.    Past Psychiatric History: None  Risk to Self: Suicidal Ideation: No Suicidal Intent: No Is patient at risk for suicide?: No Suicidal Plan?: No Access to Means: No What has been your use of drugs/alcohol within the last 12 months?: None Reported How many times?: 0 Other Self Harm Risks:  pt confusion and mental status Intentional Self Injurious Behavior: None Risk to Others: Homicidal Ideation: No Thoughts of Harm to Others: No Current Homicidal Intent: No Current Homicidal Plan: No Assessment of Violence: None Noted Does patient have access to weapons?:  (UTA due to pt altered mental status & difficulty processing) Criminal Charges Pending?:  (UTA due to pt altered mental status & difficulty processing) Does patient have a court date:  (UTA due to pt altered mental status & difficulty processing) Prior Inpatient Therapy: Prior Inpatient Therapy: No Prior Outpatient Therapy: Prior Outpatient Therapy: Yes Prior Therapy Dates:   (UTA due to pt altered mental status & difficulty processing) Prior Therapy Facilty/Provider(s):  (UTA due to pt altered mental status & difficulty processing) Reason for Treatment:  (UTA due to pt altered mental status & difficulty processing) Does patient have an ACCT team?: Unknown Does patient have Intensive In-House Services?  : No Does patient have [facility] services? : Unknown Does patient have P4CC services?: Unknown  Past Medical History: History reviewed. No pertinent past medical history. History reviewed. No pertinent past surgical history. Family History: No family history on file. Family Psychiatric  History: None Social History:  History  Alcohol Use  . 0.0 oz/week  . 0 Standard drinks or equivalent per week     History  Drug Use No    Social History   Social History  . Marital Status: Single    Spouse Name: N/A  . Number of Children: N/A  . Years of Education: N/A   Social History Main Topics  . Smoking status: Former Research scientist (life sciences)  . Smokeless tobacco: None  . Alcohol Use: 0.0 oz/week    0 Standard drinks or equivalent per week  . Drug Use: No  . Sexual Activity: Yes    Birth Control/ Protection: Injection   Other Topics Concern  . None   Social History Narrative   Additional Social History:    Allergies:  No Known Allergies  Labs:  Results for orders placed or performed during the hospital encounter of 07/26/15 (from the past 48 hour(s))  Urine rapid drug screen (hosp performed)     Status: None   Collection Time: 07/26/15 10:20 PM  Result Value Ref Range   Opiates  NONE DETECTED NONE DETECTED   Cocaine NONE DETECTED NONE DETECTED   Benzodiazepines NONE DETECTED NONE DETECTED   Amphetamines NONE DETECTED NONE DETECTED   Tetrahydrocannabinol NONE DETECTED NONE DETECTED   Barbiturates NONE DETECTED NONE DETECTED    Comment:        DRUG SCREEN FOR MEDICAL PURPOSES ONLY.  IF CONFIRMATION IS NEEDED FOR ANY PURPOSE, NOTIFY LAB WITHIN 5 DAYS.         LOWEST DETECTABLE LIMITS FOR URINE DRUG SCREEN Drug Class       Cutoff (ng/mL) Amphetamine      1000 Barbiturate      200 Benzodiazepine   353 Tricyclics       299 Opiates          300 Cocaine          300 THC              50   Urinalysis, Routine w reflex microscopic (not at [facility])     Status: Abnormal   Collection Time: 07/26/15 10:20 PM  Result Value Ref Range   Color, Urine YELLOW YELLOW   APPearance CLEAR CLEAR   Specific Gravity, Urine 1.028 1.005 - 1.030   pH 6.0 5.0 - 8.0   Glucose, UA NEGATIVE NEGATIVE mg/dL   Hgb urine dipstick LARGE (A) NEGATIVE   Bilirubin Urine NEGATIVE NEGATIVE   Ketones, ur 40 (A) NEGATIVE mg/dL   Protein, ur 100 (A) NEGATIVE mg/dL   Nitrite NEGATIVE NEGATIVE   Leukocytes, UA NEGATIVE NEGATIVE  Urine microscopic-add on     Status: Abnormal   Collection Time: 07/26/15 10:20 PM  Result Value Ref Range   Squamous Epithelial / LPF NONE SEEN NONE SEEN   WBC, UA NONE SEEN 0 - 5 WBC/hpf   RBC / HPF TOO NUMEROUS TO COUNT 0 - 5 RBC/hpf   Bacteria, UA FEW (A) NONE SEEN   Casts HYALINE CASTS (A) NEGATIVE   Urine-Other MUCOUS PRESENT   Pregnancy, urine     Status: None   Collection Time: 07/26/15 10:20 PM  Result Value Ref Range   Preg Test, Ur NEGATIVE NEGATIVE    Comment:        THE SENSITIVITY OF THIS METHODOLOGY IS >20 mIU/mL.   Comprehensive metabolic panel     Status: Abnormal   Collection Time: 07/26/15 10:43 PM  Result Value Ref Range   Sodium 137 135 - 145 mmol/L   Potassium 3.5 3.5 - 5.1 mmol/L   Chloride 101 101 - 111 mmol/L   CO2 20 (L) 22 - 32 mmol/L   Glucose, Bld 101 (H) 65 - 99 mg/dL   BUN 8 6 - 20 mg/dL   Creatinine, Ser 0.76 0.44 - 1.00 mg/dL   Calcium 9.5 8.9 - 10.3 mg/dL   Total Protein 8.9 (H) 6.5 - 8.1 g/dL   Albumin 4.9 3.5 - 5.0 g/dL   AST 30 15 - 41 U/L   ALT 20 14 - 54 U/L   Alkaline Phosphatase 47 38 - 126 U/L   Total Bilirubin 0.5 0.3 - 1.2 mg/dL   GFR calc non Af Amer >60 >60 mL/min   GFR calc Af Amer >60  >60 mL/min    Comment: (NOTE) The eGFR has been calculated using the CKD EPI equation. This calculation has not been validated in all clinical situations. eGFR's persistently <60 mL/min signify possible Chronic Kidney Disease.    Anion gap 16 (H) 5 - 15  CBC     Status: Abnormal  Collection Time: 07/26/15 10:43 PM  Result Value Ref Range   WBC 3.4 (L) 4.0 - 10.5 K/uL   RBC 4.33 3.87 - 5.11 MIL/uL   Hemoglobin 13.6 12.0 - 15.0 g/dL   HCT 38.7 36.0 - 46.0 %   MCV 89.4 78.0 - 100.0 fL   MCH 31.4 26.0 - 34.0 pg   MCHC 35.1 30.0 - 36.0 g/dL   RDW 12.3 11.5 - 15.5 %   Platelets 138 (L) 150 - 400 K/uL  Ethanol     Status: None   Collection Time: 07/26/15 10:43 PM  Result Value Ref Range   Alcohol, Ethyl (B) <5 <5 mg/dL    Comment:        LOWEST DETECTABLE LIMIT FOR SERUM ALCOHOL IS 5 mg/dL FOR MEDICAL PURPOSES ONLY   CBG monitoring, ED     Status: Abnormal   Collection Time: 07/26/15 10:44 PM  Result Value Ref Range   Glucose-Capillary 106 (H) 65 - 99 mg/dL  I-Stat beta hCG blood, ED ([facility], [facility], [facility] only)     Status: None   Collection Time: 07/26/15 10:50 PM  Result Value Ref Range   I-stat hCG, quantitative <5.0 <5 mIU/mL   Comment 3            Comment:   GEST. AGE      CONC.  (mIU/mL)   <=1 WEEK        5 - 50     2 WEEKS       50 - 500     3 WEEKS       100 - 10,000     4 WEEKS     1,000 - 30,000        FEMALE AND NON-PREGNANT FEMALE:     LESS THAN 5 mIU/mL     Current Facility-Administered Medications  Medication Dose Route Frequency Provider Last Rate Last Dose  . acetaminophen (TYLENOL) tablet 650 mg  650 mg Oral Q4H PRN Lacretia Leigh, MD      . alum & mag hydroxide-simeth (MAALOX/MYLANTA) 200-200-20 MG/5ML suspension 30 mL  30 mL Oral PRN Lacretia Leigh, MD      . ibuprofen (ADVIL,MOTRIN) tablet 600 mg  600 mg Oral Q8H PRN Lacretia Leigh, MD      . LORazepam (ATIVAN) tablet 1 mg  1 mg Oral Q8H PRN Lacretia Leigh, MD      . nicotine (NICODERM CQ - dosed in mg/24 hours)  patch 21 mg  21 mg Transdermal Daily Lacretia Leigh, MD      . ondansetron [facility]) tablet 4 mg  4 mg Oral Q8H PRN Lacretia Leigh, MD      . white petrolatum (VASELINE) gel   Topical PRN Patrecia Pour, NP       Current Outpatient Prescriptions  Medication Sig Dispense Refill  . etonogestrel (NEXPLANON) 68 MG IMPL implant 1 each by Subdermal route once.    Marland Kitchen levocetirizine (XYZAL) 5 MG tablet Take 1 tablet (5 mg total) by mouth every evening. 30 tablet 5  . pseudoephedrine (SUDAFED) 30 MG tablet Take 30 mg by mouth every 4 (four) hours as needed for congestion.    . mometasone (NASONEX) 50 MCG/ACT nasal spray Use 2 sprays each nostril daily for allergies (Patient not taking: Reported on 07/26/2015) 17 g 12    Musculoskeletal: Strength & Muscle Tone: within normal limits Gait & Station: normal Patient leans: N/A  Psychiatric Specialty Exam: Review of Systems  Constitutional: Negative.   HENT: Negative.  Eyes: Negative.   Respiratory: Negative.   Cardiovascular: Negative.   Gastrointestinal: Negative.   Genitourinary: Negative.   Musculoskeletal: Negative.   Skin: Negative.   Neurological: Negative.   Endo/Heme/Allergies: Negative.   Psychiatric/Behavioral: Positive for hallucinations and memory loss. The patient is nervous/anxious and has insomnia.     Blood pressure 134/88, pulse 119, temperature 98.5 F (36.9 C), temperature source Oral, resp. rate 16, last menstrual period 07/22/2015, SpO2 100 %.There is no weight on file to calculate BMI.  General Appearance: Casual  Eye Contact::  Fair  Speech:  Normal Rate  Volume:  Normal  Mood:  Anxious  Affect:  Congruent  Thought Process:  Circumstantial  Orientation:  Full (Time, Place, and Person)  Thought Content:  Hallucinations: Visual and Paranoid Ideation  Suicidal Thoughts:  No  Homicidal Thoughts:  No  Memory:  Immediate;   Fair Recent;   Fair Remote;   Fair  Judgement:  Impaired  Insight:  Lacking  Psychomotor  Activity:  Normal  Concentration:  Fair  Recall:  [employer] of Knowledge:Good  Language: Good  Akathisia:  No  Handed:  Right  AIMS (if indicated):     Assets:  Housing Leisure Time Physical Health Resilience Social Support  ADL's:  Intact  Cognition: Impaired,  Mild  Sleep:      Treatment Plan Summary: Daily contact with patient to assess and evaluate symptoms and progress in treatment, Medication management and Plan psychosis, acute onset:  -Crisis stabilization -Medication management:  Ativan 1 mg every 8 hours PRN anxiety -Individual counseling  Disposition: Recommend psychiatric Inpatient admission when medically cleared.  Waylan Boga, NP 07/27/2015 10:55 AM  Patient seen for face-to-face psychiatric evaluation, case discussed with the treatment team and physician extender and formulated treatment plan. Reviewed the information documented and agree with the treatment plan.  Jalaya Sarver,JANARDHAHA R. 07/27/2015 3:00 PM

## 2015-07-27 NOTE — ED Notes (Signed)
tts into see 

## 2015-07-27 NOTE — Progress Notes (Signed)
Did not attended 

## 2015-07-27 NOTE — Progress Notes (Addendum)
Patient ID: Tara Yang, female   DOB: Feb 17, 1986, 30 y.o.   MRN: [id number]  Pt presents to [facility] from [facility] ED. Pt was in search room when she asked to go to the bathroom. Nurse and tech assisted pt to bathroom and used the restroom. Then pt began to wail and cry about "a man in the next stall" even though the stall was empty. Pt was then escorted to her room on the unit with the assistance of three staff members. Pt is currently paranoid, concerned about men entering her room and taking medications. Pt did take an oral medication per provider. Per report patient has not slept since Thursday and has been having bouts of insomnia. Pt also reported confusion.   Pt placed on a 1:1 for safety. Pt currently endorses SI/HI and A/V hallucinations. Pt reports passive SI. Pt also reports HI towards "my uncle, if I were to see him right now..." Pt also reports hearing voices including "yours, mine and others." Pt looks at the door and says "I saw it move up and down, see there?" Pt verbally agrees to ask staff if SI/HI or A/VH occurs and to consult with staff before acting on any harmful thoughts. At this time, I was unable to complete patients admission due to psychosis. Pt encouraged emotionally, speaks to her mom on the phone. Pt stable at this time. Pt given the opportunity to express concerns and ask questions. Report given to accepting nurse.Unable to complete EKG due to pt agitation and paranoid delusions.

## 2015-07-27 NOTE — ED Notes (Addendum)
Medications filled this week per [facility]-nasonex, and levocetirizins 5 mg--no thyroid medication--no other medications
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
## 2015-07-27 NOTE — ED Notes (Signed)
Dr Estell Harpin updated

## 2015-07-27 NOTE — ED Notes (Signed)
Dr J and jamison into see 

## 2015-07-27 NOTE — ED Notes (Signed)
Lab contacted and will be back to redraw
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
## 2015-07-27 NOTE — ED Notes (Signed)
Up tot he bathroom to shower and change scrubs 

## 2015-07-27 NOTE — ED Notes (Addendum)
Calmer, but still fearful. Pt unable to go to her current room "he's in there."  Pt walked to other room (37). Sitting, Positioned in room near door, facing away from the TV, eating lunch.
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
## 2015-07-27 NOTE — ED Notes (Signed)
Jamison DNP updated w/ VS, give dose now, then transfer to [facility]

## 2015-07-27 NOTE — ED Notes (Signed)
Up to the bathrrom

## 2015-07-27 NOTE — ED Notes (Signed)
PT encouraged to rest, PO fluids encouraged
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
## 2015-07-27 NOTE — ED Notes (Signed)
Talking quielty w/ brittany

## 2015-07-27 NOTE — ED Notes (Addendum)
[place], resting on bed, watching tv

## 2015-07-27 NOTE — ED Notes (Signed)
Pt offered ativan, but declined, reported that she did not feel that she needed it now , but would take it later if she needed.  Pt again voiced concerns about people being able to enter her room, reasurred that she is safe here.

## 2015-07-27 NOTE — ED Notes (Signed)
Pt belongings sent home with mother and father

## 2015-07-27 NOTE — ED Notes (Signed)
[place] in to sit w/ pt.  Pt eating lunch receptive to having someone with her, but is still fearful  reasurred.

## 2015-07-27 NOTE — Progress Notes (Signed)
Patient ID: Tara Yang, female   DOB: Jul 17, 1985, 30 y.o.   MRN: [id number]  1:1 Note: Pt sitting on bench in room. Multiple staff members present currently. Pt periodically agitated then tearful. Pt states "Who is that outside of the door?" Pt paranoid delusion about a man coming to "in here" and having "the children and a woman in there." Pt reports to staff that she is afraid to be alone. Pt endorses auditory and visual hallucinations of voices and moving walls to staff.   Pt placed on a 1:1 per providers orders. Pt offered emotional support and encouragement to take medications, see MAR. Pt offered to be taken to the quiet room but pt states "it's too cold in here." Pt taken back to room.   Pt remains safe. Pt currently in no physical distress. Sitter at bedside. Will continue to monitor.

## 2015-07-27 NOTE — BH Assessment (Signed)
Writer contacted [facility] to request cart placement for TTS Consult. 

## 2015-07-27 NOTE — ED Notes (Signed)
Lab here to draw, pt still in the bathroom

## 2015-07-27 NOTE — ED Notes (Addendum)
Pt ambulatory w/o difficulty to [facility], sitter w/ pt.  Pt has no belongings but will call her mother th bring her clothes.  Pt ambulatory w/o difficulty but insisted that someone [place]) walk directly behind her.  Olivette updated concerning VS and medication given prior to leaving.

## 2015-07-27 NOTE — ED Notes (Signed)
Dr J and Jamison DNP in to see 

## 2015-07-28 ENCOUNTER — Encounter (HOSPITAL_COMMUNITY): Payer: Self-pay | Admitting: Psychiatry

## 2015-07-28 DIAGNOSIS — F29 Unspecified psychosis not due to a substance or known physiological condition: Secondary | ICD-10-CM

## 2015-07-28 LAB — URINE CULTURE

## 2015-07-28 LAB — RPR: RPR: NONREACTIVE

## 2015-07-28 MED ORDER — PSEUDOEPHEDRINE HCL 30 MG/5ML PO SYRP
60.0000 mg | ORAL_SOLUTION | Freq: Four times a day (QID) | ORAL | Status: DC | PRN
  Filled 2015-07-28 (×2): qty 10

## 2015-07-28 MED ORDER — MENTHOL 3 MG MT LOZG
1.0000 | LOZENGE | OROMUCOSAL | Status: DC | PRN
  Administered 2015-07-28: 3 mg via ORAL

## 2015-07-28 NOTE — BHH Group Notes (Signed)
[facility] LCSW Group Therapy  07/28/2015 11:00 AM   Type of Therapy:  Group Therapy  Participation Level:  Did Not Attend  Reyes Ivan, LCSW 07/28/2015 2:27 PM

## 2015-07-28 NOTE — Progress Notes (Signed)
D: Patient is resting in bed with eyes closed. No sign or symptoms of distress.  A: 1:1 staff remains with pt for safety.  R: Pt remains safe on the unit.   

## 2015-07-28 NOTE — BHH Group Notes (Signed)
[facility] Group Notes:  (Nursing/MHT/Case Management/Adjunct)  Date:  07/28/2015  Time:0930  Type of Therapy:  Nurse Education  Participation Level:  Did Not Attend   Summary of Progress/Problems:  Dara Hoyer 07/28/2015, 12:52 PM
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
## 2015-07-28 NOTE — Progress Notes (Signed)
1:1 note:  EKG completed and placed on front of chart. Pt noted in bed, appeared to be sleeping majority of this evening. Pt currently sitting up in char in the day room eating dinner, assigned nursing staff present. Pt reports to this nurse she feels better. Pt c/o generalized weakness, gait slow, but steady Will continue to monitor on 1:1 for safety.
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
## 2015-07-28 NOTE — Progress Notes (Addendum)
Unable to assess patient. Patient is resting in bed with eyes closed. No sign or symptoms of distress. Patient continues to be on 1:1 for safety.  EKG not preformed due to patient medicated.

## 2015-07-28 NOTE — Progress Notes (Signed)
1:1 note  Pt currently presents in room being monitored on 1:1, assigned nursing staff present. MD currently in room talking with pt. At start of shift pt presents with increased  Confusion, slow to respond on approach, unable to answer many questions asked by this nurse at this time. MHT with pt assisting with shower. Pt reports to this nurse she is tired and she is going to take a nap. Pt presents with paranoia. Asking this nurse if it is ok to sleep?, and if she will be hurt? Pt reports the person across the hall is looking at her. Will continue to monitor pt on 1:1 for safety.
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
## 2015-07-28 NOTE — [facility] Counselor (Signed)
CSW introduced self to pt at this time.  Pt observed to be lying in her bed with 1:1 staff present.  Pt was slow to response and 1:1 sitter states pt is still confused today.  CSW to attempt PSA when pt is more stable and able to assess.    Reyes Ivan, LCSW 07/28/2015  9:49 AM

## 2015-07-28 NOTE — Progress Notes (Signed)
Patient ID: Tara Yang, female   DOB: 1986/04/28, 30 y.o.   MRN: [id number]  Pt parents visited unit. Pt gave Clinical research associate verbal agreement to give them information. Parents updated on pts status and medications. Pt informed that she would have to sign her consent waiver form if she wished for her parents to receive any more information.

## 2015-07-29 ENCOUNTER — Encounter (HOSPITAL_COMMUNITY): Payer: Self-pay | Admitting: Psychiatry

## 2015-07-29 DIAGNOSIS — F4310 Post-traumatic stress disorder, unspecified: Secondary | ICD-10-CM | POA: Clinically undetermined

## 2015-07-29 DIAGNOSIS — F32A Depression, unspecified: Secondary | ICD-10-CM | POA: Diagnosis present

## 2015-07-29 DIAGNOSIS — F323 Major depressive disorder, single episode, severe with psychotic features: Secondary | ICD-10-CM | POA: Clinically undetermined

## 2015-07-29 DIAGNOSIS — F1010 Alcohol abuse, uncomplicated: Secondary | ICD-10-CM | POA: Clinically undetermined

## 2015-07-29 DIAGNOSIS — F329 Major depressive disorder, single episode, unspecified: Secondary | ICD-10-CM | POA: Diagnosis present

## 2015-07-29 MED ORDER — OLANZAPINE 5 MG PO TBDP: 5.0000 mg | ORAL_TABLET | Freq: Four times a day (QID) | ORAL | Status: DC | PRN

## 2015-07-29 MED ORDER — PSEUDOEPHEDRINE HCL 30 MG PO TABS
60.0000 mg | ORAL_TABLET | Freq: Four times a day (QID) | ORAL | Status: DC | PRN
  Administered 2015-07-29 – 2015-08-01 (×5): 60 mg via ORAL
  Filled 2015-07-29 (×3): qty 2

## 2015-07-29 MED ORDER — OLANZAPINE 10 MG IM SOLR: 5.0000 mg | Freq: Four times a day (QID) | INTRAMUSCULAR | Status: DC | PRN

## 2015-07-29 MED ORDER — HYDROXYZINE HCL 25 MG PO TABS
25.0000 mg | ORAL_TABLET | Freq: Four times a day (QID) | ORAL | Status: DC | PRN
  Administered 2015-07-30: 25 mg via ORAL
  Filled 2015-07-29: qty 1

## 2015-07-29 MED ORDER — SERTRALINE HCL 25 MG PO TABS
25.0000 mg | ORAL_TABLET | Freq: Every day | ORAL | Status: DC
  Administered 2015-07-29: 25 mg via ORAL
  Filled 2015-07-29 (×6): qty 1

## 2015-07-29 NOTE — [facility] Suicide Risk Assessment (Addendum)
[facility] INPATIENT:  Family/Significant Other Suicide Prevention Education  Suicide Prevention Education:  Education Completed; Tara Yang, mother, [phone],  (name of family member/significant other) has been identified by the patient as the family member/significant other with whom the patient will be residing, and identified as the person(s) who will aid the patient in the event of a mental health crisis (suicidal ideations/suicide attempt).  With written consent from the patient, the family member/significant other has been provided the following suicide prevention education, prior to the and/or following the discharge of the patient.  The suicide prevention education provided includes the following:  Suicide risk factors  Suicide prevention and interventions  National Suicide Hotline telephone number  [facility] assessment telephone number  [facility] Emergency Assistance 911  [facility] and/or Residential Mobile Crisis Unit telephone number  Request made of family/significant other to:  Remove weapons (e.g., guns, rifles, knives), all items previously/currently identified as safety concern.    Remove drugs/medications (over-the-counter, prescriptions, illicit drugs), all items previously/currently identified as a safety concern.  The family member/significant other verbalizes understanding of the suicide prevention education information provided.  The family member/significant other agrees to remove the items of safety concern listed above.  Tara Yang 07/29/2015, 12:30 PM

## 2015-07-29 NOTE — Tx Team (Signed)
Interdisciplinary Treatment Plan Update (Adult) Date: 07/29/2015   Date: 07/29/2015 4:39 PM  Progress in Treatment:  Attending groups: Minimally Participating in groups: Minimally Taking medication as prescribed: Yes  Tolerating medication: Yes  Family/Significant othe contact made: Yes, with mother Patient understands diagnosis: Yes AEB seeking help Discussing patient identified problems/goals with staff: Yes  Medical problems stabilized or resolved: Yes  Denies suicidal/homicidal ideation: Yes Patient has not harmed self or Others: Yes   New problem(s) identified: None identified at this time.  Discharge Plan or Barriers: Pt may discharge to parents' home in [place] or live with siblings here in [place]; if Pt stays in [place], [place] may be an option  Additional comments:  Patient and CSW reviewed pt's identified goals and treatment plan. Patient verbalized understanding and agreed to treatment plan. CSW reviewed [facility] "Discharge Process and Patient Involvement" Form. Pt verbalized understanding of information provided and signed form.   Medication Trial: Zoloft 48m  Reason for Continuation of Hospitalization:  Depression Medication stabilization Psychotic- Auditory Hallucinations  Estimated length of stay: 3-5 days  Review of initial/current patient goals per problem list:   1.  Goal(s): Patient will participate in aftercare plan  Met:  Progressing  Target date: 3-5 days from date of admission   As evidenced by: Patient will participate within aftercare plan AEB aftercare provider and housing plan at discharge being identified.   5.  Goal(s): Patient will demonstrate decreased signs of psychosis  . Met:  No . Target date: 3-5 days from date of admission  . As evidenced by: Patient will demonstrate decreased frequency of AVH or return to baseline function   -07/29/15: Pt continues to be confused,   however orientation is improving. She was   recently admitted with  AVH.  Attendees:  Patient:    Family:    Physician: Dr. EShea Evans MD  07/29/2015 4:39 PM  Nursing: JLars Pinks RN Case manager  07/29/2015 4:39 PM  Clinical Social Worker LPeri Maris LLatanya Presser2/27/2017 4:39 PM  Other: HManuella Ghazi RN; BGrayland Ormond RN 07/29/2015 4:39 PM  Clinical:  RN 07/29/2015 4:39 PM  Other: , RN Charge Nurse 07/29/2015 4:39 PM  Other:     LPeri Maris LRockvilleSocial Work [phone]

## 2015-07-29 NOTE — Progress Notes (Signed)
1:1 DAR Note: Tara Yang remains on 1:1 with sitter by side for safety.  She denies SI/HI or A/V hallucinations.  She just reports that she had a "nervous breakdown" and that she really doesn't want to take medication.  She did finally take the Zoloft that was prescribed.  Informed her that it would help with depression.  She was very guarded and distressed about having to be in the psychiatric hospital.  She has a hard time with males on the unit and doesn't feel safe.  She didn't want her sitter to leave for break and it took some time to explain that she will return to sit after.  Minimal interaction with peers.  She did complete her self inventory and reports that her depression is 5/10, hopelessness 0/10 and her anxiety is 2/10.  She states that her goal for today is "to speak with someone about treatment and going to groups."  She will accomplish this goal by "Just getting up and having the energy."  1:1 remains for safety.  We will continue to monitor the progress towards her goals.  Tara Yang remains safe on the unit.
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
## 2015-07-29 NOTE — Progress Notes (Signed)
Adult Psychoeducational Group Note  Date:  07/29/2015 Time:  9:10 PM  Group Topic/Focus:  Wrap-Up Group:   The focus of this group is to help patients review their daily goal of treatment and discuss progress on daily workbooks.  Participation Level:  Active  Participation Quality:  Appropriate  Affect:  Appropriate  Cognitive:  Appropriate  Insight: Appropriate  Engagement in Group:  Engaged  Modes of Intervention:  Discussion  Additional Comments:  The patient expressed that she did not attend group.  Octavio Manns 07/29/2015, 9:10 PM

## 2015-07-29 NOTE — Progress Notes (Signed)
Patient was in her room at the beginning of the shift with her visitors. She appeared labile and disorganized during the assessment,. Thought process tangential and writer unable to comprehend what patient was saying. He complaint of coughing and refused Cepacol that was ordered; "I don't like the taste". Writer notified the NP on call, she ordered Sudafed for patient. Patient asleep by the time the security brought the medication from [facility]. 1:1 observations continues for safety.
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
## 2015-07-29 NOTE — BHH Group Notes (Signed)
[facility] LCSW Aftercare Discharge Planning Group Note  07/29/2015 8:45 AM  Participation Quality: Alert, Appropriate and Oriented  Mood/Affect: Flat  Depression Rating: "okay"  Anxiety Rating: "okay"  Thoughts of Suicide: Pt denies SI/HI  Will you contract for safety? Yes  Current AVH: Pt denies  Plan for Discharge/Comments: Pt attended discharge planning group and actively participated in group. CSW discussed suicide prevention education with the group and encouraged them to discuss discharge planning and any relevant barriers. Pt forwarded minimal information and expressed a desire for referral for outpatient services.  Transportation Means: Pt reports access to transportation  Supports: No supports mentioned at this time  Chad Cordial, LCSWA 07/29/2015 2:00 PM

## 2015-07-29 NOTE — Progress Notes (Addendum)
Patient is more visible on the unit tonight than she was yesterday. She endorsed a good day. Her mood and affects appropriate. She reported that the doctor started her on new medications today to help stabilize her mood. Patient stated that she would ike to be discharged on a week day so she can find a bus to where she lives. She said the transportation system to the place she lives doesn't work on weekends. She denied SI/HI and denied Hallucinations. Writer encouraged and supported patient. 1:1 observations continues as ordered for safety.

## 2015-07-29 NOTE — Progress Notes (Signed)
1:1 DAR Note: Tara Yang has been up and visible on the unit.  1:1 continues for safety with sitter by side.  She attended group this morning.  She was very quiet and guarded during our interaction.  She requested tylenol for generalized pain, 7/10.  She denies any voices or any SI/HI at this time.  She was able to state that she was at [facility] on the behavioral health unit.  Poor eye contact.  She wouldn't elaborate with questions.  1:1 continues for safety.  Encouraged continued participation in group and unit activities.  Tara Yang remains safe on the unit.

## 2015-07-29 NOTE — Clinical Social Work Note (Signed)
CSW spoke w mother, would like patient to return home w parents at discharge for period of recuperation.  Mother tearful, "this is the best place for her right now, we definitely don't want her to go back home alone."  Mother would like aftercare scheduled closer to [facility], near [place]. Mother also states that patient may be able to live w siblings here in [place] if there is no option available in [place].  Patient has never had any prior mental health treatment, no family history at home.  Mother states patient has "taken on a lot w a job promotion", has "always taken on everyone else's problems, has financial trouble, income garnished due to [employer], bought new car recently."  Mother feels patient's current issues have stemmed from significant financial and work stress, aware that she is using alcohol and not sleeping for "days."  Mother does not think patient took overdose, went to PCP for allergy treatment, had only taken 2 pills from allergy prescription received by pill count.  Mother believes combination of alcohol, lack of sleep and several allergy pills may have created current crisis.  SPE reviewed w mother.  Santa Genera, LCSW Lead Clinical Social Worker Phone:  [phone]

## 2015-07-29 NOTE — Progress Notes (Signed)
[place] has been visible on the unit.  1:1 sitter by side for safety.  Minimal interaction with peers.  No PRN's given this afternoon.  She attended groups.  Appetite is fair.  She denies any SI/HI or A/V at this time however she continues to be very guarded.  No c/o pain or discomfort and she appears to be in no physical distress.  1:1 remains for safety.  Encouraged continued participation in group and unit activities.  We will continue to monitor the progress towards her goals.

## 2015-07-29 NOTE — BHH Group Notes (Signed)
BHH LCSW Group Therapy  07/29/2015 4:16 PM  Type of Therapy:  Group Therapy  Participation Level:  Invited, in bed, did not attend  Summary of Progress/Problems:  Finding Balance in Life. Today's group focused on defining balance in one's own words, identifying things that can knock one off balance, and exploring healthy ways to maintain balance in life. Group members were asked to provide an example of a time when they felt off balance, describe how they handled that situation, and process healthier ways to regain balance in the future. Group members were asked to share the most important tool for maintaining balance that they learned while at [facility] and how they plan to apply this method after discharge.   [name]

## 2015-07-29 NOTE — Progress Notes (Signed)
Patient resting quietly with eyes closed. Respirations even and unlabored. No distress noted. 1:1 observation continues asb ordered for safety.

## 2015-07-29 NOTE — Progress Notes (Signed)
Patient resting quietly with eyes closed. Respirations even and unlabored. No distress noted. 1:1 observations continues as ordered.

## 2015-07-30 DIAGNOSIS — F1020 Alcohol dependence, uncomplicated: Secondary | ICD-10-CM | POA: Clinically undetermined

## 2015-07-30 MED ORDER — LORAZEPAM 1 MG PO TABS: 1.0000 mg | ORAL_TABLET | ORAL | Status: DC | PRN

## 2015-07-30 MED ORDER — BENZONATATE 100 MG PO CAPS
200.0000 mg | ORAL_CAPSULE | Freq: Three times a day (TID) | ORAL | Status: DC | PRN
  Administered 2015-07-31 – 2015-08-01 (×2): 200 mg via ORAL
  Filled 2015-07-30 (×2): qty 2

## 2015-07-30 MED ORDER — HYDROXYZINE HCL 25 MG PO TABS: 25.0000 mg | ORAL_TABLET | Freq: Four times a day (QID) | ORAL | Status: DC | PRN

## 2015-07-30 NOTE — BHH Group Notes (Signed)
BHH Group Notes:  (Nursing/MHT/Case Management/Adjunct)  Date:  07/30/2015  Time:  9:30am  Type of Therapy:  Nurse Education  Participation Level:  Minimal  Participation Quality:  Attentive  Affect: Flat  Cognitive:  Alert  Insight:  Limited  Engagement in Group:  Improving   Modes of Intervention:  Discussion and Education  Summary of Progress/Problems:  Group topic today was recovery.  Discussed what recovery means to the group.  Discussed long term and short term goals.  Reviewed sleep hygiene.  Tara Yang sat quietly during group with no interaction.

## 2015-07-30 NOTE — Progress Notes (Signed)
BHH Post 1:1 Observation Documentation  For the first (8) hours following discontinuation of 1:1 precautions, a progress note entry by nursing staff should be documented at least every 2 hours, reflecting the patient's behavior, condition, mood, and conversation.  Use the progress notes for additional entries.  Time 1:1 discontinued:  1400  Patient's Behavior:  Pt currently visiting with her mother and grandmother.  She is sitting quietly in her room.    Patient's Condition:  She denies SI/HI or A/V hallucinations.  Attending groups.    Patient's Conversation:  She reports that she is happy about being able to go to the cafeteria or outside for groups.    She completed her self inventory and reports that her depression, hopelessness and anxiety are 0/10.  Her goal for today is "speaking with doctor about discharge and resources to help doing outpatient."  She is accomplish this goal by :speaking to doctor and deciding on a plan."  Encouraged continued participation in group and unit activities.  Q 15 minute checks maintained for safety.  We will continue to monitor the progress towards her goals.   Norm Parcel Othniel Maret 07/30/2015, 2:44 PM

## 2015-07-30 NOTE — Progress Notes (Signed)
1:1 Note: Tara Yang remains on 1:1 for safety with sitter by side.  She has attended groups.  Minimal interaction with peers.  SHe would not take her AM medication until she talks with the doctor.  She is worried about medication and how she didn't sleep well.  She denies SI/HI or A/V hallucinations.  Reminded her to fill out self inventory.  1:1 continues for safety.  Tara Yang remains safe on the unit.

## 2015-07-30 NOTE — Progress Notes (Signed)
[facility] MD Progress Note  07/30/2015 12:48 PM Tara Yang  MRN:  [id number] Subjective:  Pt states " I am not sure why whatever happened to me happened . I cannot understand what was going on. I want to see how I will do without any medications . 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  Patient seen and chart reviewed.Discussed patient with treatment team.  Pt today continues to be on 1:1 precaution for safety reasons. Nursing to observe patient and change to closed observation and later on DC the sitter after reassessing the need . Pt today seen as less paranoid , less confused than on admission. She continues to question the events that happened to her prior to admission.Pt does report sleep issues and racing thoughts at night. She does not feel the need for any medications at this time. Pt is willing to be referred to a PHP . Pt per staff - continues to have periods when she is observed as paranoid , usually of female staff. Will continue to observe on the unit.     Principal Problem: MDD (major depressive disorder), single episode, severe with psychosis (HCC) Diagnosis:   Patient Active Problem List   Diagnosis Date Noted  . Alcohol use disorder, moderate, dependence (HCC) [F10.20] 07/30/2015  . 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 PTSD (post-traumatic stress disorder) [F43.10] 07/29/2015  . Depression [F32.9] 07/29/2015   Total Time spent with patient: 30 minutes  Past Psychiatric History: denies  Past Medical History: denies Family History:  Family History  Problem Relation Age of Onset  . Alcoholism Other     Family Psychiatric  History: denies family hx of mental illness, suicide, drug abuse. Social History: Pt is single , works at a [employer] for developmental disability for children, denies having children. History  Alcohol Use  . 0.0 oz/week  . 0 Standard drinks or equivalent per week    Comment: unknown as pt not able to recall     History  Drug Use No    Comment: unknown as pt is not able to recall    Social History   Social History  . Marital Status: Single    Spouse Name: N/A  . Number of Children: N/A  . Years of Education: N/A   Social History Main Topics  . Smoking status: Former Games developer  . Smokeless tobacco: None  . Alcohol Use: 0.0 oz/week    0 Standard drinks or equivalent per week     Comment: unknown as pt not able to recall  . Drug Use: No     Comment: unknown as pt is not able to recall  . Sexual Activity: Yes    Birth Control/ Protection: Injection   Other Topics Concern  . None   Social History Narrative   Additional Social History:                         Sleep: Poor  Appetite:  Fair  Current Medications: Current Facility-Administered Medications  Medication Dose Route Frequency Provider Last Rate Last Dose  . acetaminophen (TYLENOL) tablet  650 mg  650 mg Oral Q6H PRN Oneta Rack, NP   650 mg at 07/29/15 2125  . alum & mag hydroxide-simeth (MAALOX/MYLANTA) 200-200-20 MG/5ML suspension 30 mL  30 mL Oral Q4H PRN Oneta Rack, NP      . diphenhydrAMINE (BENADRYL) injection 25 mg  25 mg Intramuscular Once Oneta Rack, NP   25 mg at 07/27/15 1645  . hydrOXYzine (ATARAX/VISTARIL) tablet 25 mg  25 mg Oral Q6H PRN Jomarie Longs, MD      . LORazepam (ATIVAN) injection 1 mg  1 mg Intramuscular Once Oneta Rack, NP   1 mg at 07/27/15 1646  . LORazepam (ATIVAN) tablet 1 mg  1 mg Oral Q4H PRN Tamyra Fojtik, MD      . magnesium hydroxide (MILK OF MAGNESIA) suspension 30 mL  30 mL Oral Daily PRN Oneta Rack, NP      . menthol-cetylpyridinium  (CEPACOL) lozenge 3 mg  1 lozenge Oral PRN Oneta Rack, NP   3 mg at 07/28/15 1804  . OLANZapine zydis (ZYPREXA) disintegrating tablet 5 mg  5 mg Oral Q6H PRN Jomarie Longs, MD       Or  . OLANZapine (ZYPREXA) injection 5 mg  5 mg Intramuscular Q6H PRN Jomarie Longs, MD      . pseudoephedrine (SUDAFED) tablet 60 mg  60 mg Oral Q6H PRN Jomarie Longs, MD   60 mg at 07/29/15 2127    Lab Results: No results found for this or any previous visit (from the past 48 hour(s)).  Blood Alcohol level:  Lab Results  Component Value Date   ETH <5 07/26/2015    Physical Findings: AIMS: Facial and Oral Movements Muscles of Facial Expression: None, normal Lips and Perioral Area: None, normal Jaw: None, normal Tongue: None, normal,Extremity Movements Upper (arms, wrists, hands, fingers): None, normal Lower (legs, knees, ankles, toes): None, normal, Trunk Movements Neck, shoulders, hips: None, normal, Overall Severity Severity of abnormal movements (highest score from questions above): None, normal Incapacitation due to abnormal movements: None, normal Patient's awareness of abnormal movements (rate only patient's report): No Awareness,    CIWA:    COWS:     Musculoskeletal: Strength & Muscle Tone: within normal limits Gait & Station: normal Patient leans: N/A  Psychiatric Specialty Exam: Review of Systems  Psychiatric/Behavioral: Positive for substance abuse. The patient is nervous/anxious and has insomnia.   All other systems reviewed and are negative.   Blood pressure 117/83, pulse 110, temperature 98.5 F (36.9 C), temperature source Oral, resp. rate 16, height  (1.651 m), weight 81.647 kg (180 lb), last menstrual period 07/22/2015, SpO2 98 %.Body mass index is 29.95 kg/(m^2).  General Appearance: Disheveled  Eye Solicitor::  Fair  Speech:  Slow  Volume:  Decreased  Mood:  Anxious  Affect:  Depressed  Thought Process:  Goal Directed  Orientation:  Full (Time, Place, and  Person)  Thought Content:  Delusions, Ideas of Reference:   Paranoia Delusions, Paranoid Ideation and Rumination  Suicidal Thoughts:  No  Homicidal Thoughts:  No  Memory:  Immediate;   Fair Recent;   Fair Remote;   Fair  Judgement:  Fair  Insight:  Fair  Psychomotor Activity:  Decreased  Concentration:  Poor  Recall:  [employer] of Knowledge:Fair  Language: Fair  Akathisia:  No  Handed:  Right  AIMS (if indicated):     Assets:  Desire for Improvement  ADL's:  Intact  Cognition: WNL  Sleep:  Number  of Hours: 2.25   Treatment Plan Summary:Tara Yang is a 30 y.o.AA female, single , who lives in [place] , 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 Pt does not want any medications at this time other than Vistaril. Will continue treatment. Will change 1:1 precaution to continuous obs and then reassess.   Daily contact with patient to assess and evaluate symptoms and progress in treatment and Medication management Reviewed past medical records,treatment plan. Reviewed Tara Leader LCSW notes in EHR. Will DC Zoloft 25 mg po daily for affective sx.Pt is refusing to take it. Will continue Vistaril 25 mg po tid prn for anxiety/sleep issues. Will add CIWA protocol/ativan PRN for CIWA>/=10 - Pt was abusing alcohol on a daily basis. Will make available PRN medications as per agitation protocol. Will continue to monitor vitals ,medication compliance and treatment side effects while patient is here.  Will monitor for medical issues as well as call consult as needed.  Reviewed labs TSH - wnl, CT scan head - wnl ,will order as needed.  CSW will start working on disposition. Pt to be referred for PHP once stable. Patient to participate in therapeutic milieu .      Micky Sheller, MD 07/30/2015,  12:48 PM

## 2015-07-30 NOTE — [facility] Group Notes (Signed)
[facility] LCSW Group Therapy 07/30/2015 1:15pm  Type of Therapy: Group Therapy- Feelings Around Discharge & Establishing a Supportive Framework  Participation Level: Reserved  Modes of Intervention: Clarification, Confrontation, Discussion, Education, Exploration, Limit-setting, Orientation, Problem-solving, Rapport Building, Dance movement psychotherapist, Socialization and Support   Description of Group:   What is a supportive framework? What does it look like feel like and how do I discern it from and unhealthy non-supportive network? Learn how to cope when supports are not helpful and don't support you. Discuss what to do when your family/friends are not supportive.  Summary of Patient Progress Pt was more active in discussion but remains reserved. She was able to identify specific examples of community as well as list benefits that community provides such as accountability and support.   Therapeutic Modalities:   Cognitive Behavioral Therapy Person-Centered Therapy Motivational Interviewing   Chad Cordial, LCSWA 07/30/2015 1:56 PM

## 2015-07-30 NOTE — Progress Notes (Signed)
[facility] Post 1:1 Observation Documentation  For the first (8) hours following discontinuation of 1:1 precautions, a progress note entry by nursing staff should be documented at least every 2 hours, reflecting the patient's behavior, condition, mood, and conversation.  Use the progress notes for additional entries.  Time 1:1 discontinued:  2:00pm  Patient's Behavior:  Pt is currently sitting in day room.  She is quiet and cooperative.  Patient's Condition:  She denies any SI/HI or A/V hallucinations.  She had a visit with her mother and grandmother this afternoon.  She denies any pain or discomfort and appears to be in no physical distress.    Patient's Conversation:  She voiced no issues and reviewed medications with her.  She continues to look forward to going off the unit tonight.  We will continue to monitor the progress towards her goals.  Q 15 minute checks maintained for safety.  Lamees remains safe on the unit.   Levin Bacon 07/30/2015, 4:43 PM

## 2015-07-30 NOTE — Progress Notes (Signed)
Patient came to the window to request for Ativan, writer told patient that it has been discontinues and offered patient Vistaril to help her go back to sleep. Patient refused the Vistaril but later come back and toot it. She said she does not feel with [place]. Writer explained to patient that Vistaril is for sleep and anxiety and does not have potential for addiction, so most patient on  The unit have prn order for Vistaril or Trazodone for sleep and that Ativan is not usually prescribed for sleep. Patient requested for pecil and wrote in her journal what she would discuss with the doctor tomorrow.  Patient appeared paranoid and thought process disorganized. 1:1 observations continue for safety.

## 2015-07-30 NOTE — Progress Notes (Signed)
[facility] Post 1:1 Observation Documentation  For the first (8) hours following discontinuation of 1:1 precautions, a progress note entry by nursing staff should be documented at least every 2 hours, reflecting the patient's behavior, condition, mood, and conversation.  Use the progress notes for additional entries.  Time 1:1 discontinued:  2:00pm  Patient's Behavior:  She is currently in the cafeteria for supper.  She has been sitting in the day room watching TV.  Occasionally coming to talk with staff about movies that she likes to watch.  Patient's Condition:  She is pleasant and cooperative.  Alert and oriented X 3.  She denies any pain or discomfort and appears to be in no physical distress.    Patient's Conversation:  She talks about movies that she likes and that she is excited aobut visitors coming tonight.  She is appropriate a no complaints voiced.    Q 15 minute checks maintained for safety.  Encouraged continued participation in group and unit activities.  We will continue to monitor the progress towards her goals.   Norm Parcel Erikson Danzy 07/30/2015, 6:03 PM

## 2015-07-30 NOTE — Progress Notes (Signed)
Adult Psychoeducational Group Note  Date:  07/30/2015 Time:  9:12 PM  Group Topic/Focus:  Wrap-Up Group:   The focus of this group is to help patients review their daily goal of treatment and discuss progress on daily workbooks.  Participation Level:  Active  Participation Quality:  Appropriate  Affect:  Appropriate  Cognitive:  Appropriate  Insight: Appropriate  Engagement in Group:  Engaged  Modes of Intervention:  Discussion  Additional Comments: The patient express that she attended groups today.The patient also said group was about community.  Octavio Manns 07/30/2015, 9:12 PM

## 2015-07-30 NOTE — Progress Notes (Signed)
Gaye requested to sign 72 hour request for discharge.  Form signed at 1205pm and placed on the front of the chart.

## 2015-07-30 NOTE — Progress Notes (Addendum)
[facility] Post 1:1 Observation Documentation  For the first (8) hours following discontinuation of 1:1 precautions, a progress note entry by nursing staff should be documented at least every 2 hours, reflecting the patient's behavior, condition, mood, and conversation. Use the progress notes for additional entries.  Time 1:1 discontinued: 2:00pm  Patient's Behavior: She attended group.  She has been sitting in the day room watching TV. Walking around unit.  Patient's Condition: She is pleasant and cooperative. Alert and oriented X 3. She denies any pain or discomfort and appears to be in no physical distress.   Patient's Conversation: Patient talked about plan for when discharged, and about taking a leave from stressful job.   Q 15 minute checks maintained for safety. Encouraged continued participation in group and unit activities. We will continue to monitor the progress towards her goals.

## 2015-07-31 DIAGNOSIS — F1010 Alcohol abuse, uncomplicated: Secondary | ICD-10-CM

## 2015-07-31 DIAGNOSIS — F4310 Post-traumatic stress disorder, unspecified: Secondary | ICD-10-CM

## 2015-07-31 NOTE — [facility] Group Notes (Signed)
[facility] Mental Health Association Group Therapy 07/31/2015 1:15pm  Type of Therapy: Mental Health Association Presentation  Participation Level: Active  Participation Quality: Attentive  Affect: Appropriate  Cognitive: Oriented  Insight: Developing/Improving  Engagement in Therapy: Engaged  Modes of Intervention: Discussion, Education and Socialization  Summary of Progress/Problems: Mental Health Association (MHA) Speaker came to talk about his personal journey with substance abuse and addiction. The pt processed ways by which to relate to the speaker. MHA speaker provided handouts and educational information pertaining to groups and services offered by the [facility]. Pt was engaged in speaker's presentation and was receptive to resources provided.    Chad Cordial, LCSWA 07/31/2015 4:00 PM

## 2015-07-31 NOTE — Progress Notes (Signed)
Adult Psychoeducational Group Note  Date:  07/31/2015 Time:  9:26 PM  Group Topic/Focus:  Wrap-Up Group:   The focus of this group is to help patients review their daily goal of treatment and discuss progress on daily workbooks.  Participation Level:  Active  Participation Quality:  Appropriate  Affect:  Appropriate  Cognitive:  Alert  Insight: Appropriate  Engagement in Group:  Engaged  Modes of Intervention:  Discussion  Additional Comments:  Patient goal for today was to find out what happened to herself over the weekend. Writer asked did she meet her goal, patient stated "It's a process". On a scale between 1-10, (1=worse, 10=best) patient rated her day a 10.  Tara Yang L Tara Yang 07/31/2015, 9:26 PM

## 2015-07-31 NOTE — [facility] Group Notes (Signed)
[facility] LCSW Aftercare Discharge Planning Group Note  07/31/2015 8:45 AM  Participation Quality: Alert, Appropriate and Oriented  Mood/Affect: Flat  Depression Rating: 0  Anxiety Rating: 0  Thoughts of Suicide: Pt denies SI/HI  Will you contract for safety? Yes  Current AVH: Pt denies  Plan for Discharge/Comments: Pt attended discharge planning group and actively participated in group. CSW discussed suicide prevention education with the group and encouraged them to discuss discharge planning and any relevant barriers. Pt identified no needs this morning. She is agreeable to outpatient referrals and reports that she will be moving to [place] to live with her mother.  Transportation Means: Pt reports access to transportation  Supports: No supports mentioned at this time  Tara Yang, Tara Yang 07/31/2015 3:58 PM

## 2015-07-31 NOTE — Progress Notes (Signed)
[facility] Post 1:1 Observation Documentation  For the first (8) hours following discontinuation of 1:1 precautions, a progress note entry by nursing staff should be documented at least every 2 hours, reflecting the patient's behavior, condition, mood, and conversation. Use the progress notes for additional entries.  Time 1:1 discontinued: 2:00pm  Patient's Behavior: She attended group.  She has been sitting in the day room watching TV. Walking around unit. Took allergie medication.   Patient's Condition: She is pleasant and cooperative. Alert and oriented X 3. She denies any pain or discomfort and appears to be in no physical distress.   Patient's Conversation: Patient talked about plan for when discharged, and about taking a leave from stressful job.   Q 15 minute checks maintained for safety. Encouraged continued participation in group and unit activities. We will continue to monitor the progress towards her goals.

## 2015-07-31 NOTE — Progress Notes (Signed)
[facility] MD Progress Note  07/31/2015 2:40 PM Tara Yang  MRN:  [id number] Subjective:  Pt states " I do not feel depressed. 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  Patient seen and chart reviewed.Discussed patient with treatment team.  Pt today continues to improve , appears calm , reports sleep as better. She continues to refuse any medications. Pt reports appetite as poor , but states she does not want to force herself to eat. Pt is open to following up with out patient clinic , however reports she does not want to be on medications. Pt per staff - continues to have periods when she is observed as paranoid , usually of female staff. Will continue to observe on the unit.     Principal Problem: MDD (major depressive disorder), single episode, severe with psychosis (HCC) Diagnosis:   Patient Active Problem List   Diagnosis Date Noted  . Alcohol use disorder, moderate, dependence (HCC) [F10.20] 07/30/2015  . 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 PTSD (post-traumatic stress disorder) [F43.10] 07/29/2015  . Depression [F32.9] 07/29/2015   Total Time spent with patient: 25 minutes  Past Psychiatric History: denies  Past Medical History: denies Family History:  Family History  Problem Relation Age of Onset  . Alcoholism Other    Family Psychiatric  History: denies family hx of mental illness, suicide, drug abuse. Social History: Pt is single , works at a [employer] for developmental disability for children, denies having children. History  Alcohol Use  . 0.0 oz/week  . 0 Standard drinks or equivalent per week    Comment: unknown as pt not able to recall     History  Drug Use No   Comment: unknown as pt is not able to recall    Social History   Social History  . Marital Status: Single    Spouse Name: N/A  . Number of Children: N/A  . Years of Education: N/A   Social History Main Topics  . Smoking status: Former Games developer  . Smokeless tobacco: None  . Alcohol Use: 0.0 oz/week    0 Standard drinks or equivalent per week     Comment: unknown as pt not able to recall  . Drug Use: No     Comment: unknown as pt is not able to recall  . Sexual Activity: Yes    Birth Control/ Protection: Injection   Other Topics Concern  . None   Social History Narrative   Additional Social History:                         Sleep: Fair  Appetite:  Fair  Current Medications: Current Facility-Administered Medications  Medication Dose Route Frequency Provider Last Rate Last Dose  . acetaminophen (TYLENOL) tablet 650 mg  650 mg Oral Q6H PRN Oneta Rack, NP   650 mg at 07/29/15 2125  . alum & mag hydroxide-simeth (MAALOX/MYLANTA) 200-200-20 MG/5ML suspension 30 mL  30 mL Oral Q4H PRN Oneta Rack, NP      . benzonatate (TESSALON) capsule 200 mg  200 mg Oral TID PRN Kerry Hough, PA-C      . diphenhydrAMINE (  BENADRYL) injection 25 mg  25 mg Intramuscular Once Oneta Rack, NP   25 mg at 07/27/15 1645  . hydrOXYzine (ATARAX/VISTARIL) tablet 25 mg  25 mg Oral Q6H PRN Jomarie Longs, MD      . LORazepam (ATIVAN) injection 1 mg  1 mg Intramuscular Once Oneta Rack, NP   1 mg at 07/27/15 1646  . LORazepam (ATIVAN) tablet 1 mg  1 mg Oral Q4H PRN Tara Zima, MD      . magnesium hydroxide (MILK OF MAGNESIA) suspension 30 mL  30 mL Oral Daily PRN Oneta Rack, NP      . menthol-cetylpyridinium (CEPACOL) lozenge 3 mg  1 lozenge Oral PRN Oneta Rack, NP   3 mg at 07/28/15 1804  . OLANZapine zydis (ZYPREXA) disintegrating tablet 5 mg  5 mg Oral Q6H PRN Jomarie Longs, MD       Or  . OLANZapine (ZYPREXA) injection 5 mg  5 mg Intramuscular Q6H PRN Jomarie Longs,  MD      . pseudoephedrine (SUDAFED) tablet 60 mg  60 mg Oral Q6H PRN Jomarie Longs, MD   60 mg at 07/30/15 2043    Lab Results: No results found for this or any previous visit (from the past 48 hour(s)).  Blood Alcohol level:  Lab Results  Component Value Date   ETH <5 07/26/2015    Physical Findings: AIMS: Facial and Oral Movements Muscles of Facial Expression: None, normal Lips and Perioral Area: None, normal Jaw: None, normal Tongue: None, normal,Extremity Movements Upper (arms, wrists, hands, fingers): None, normal Lower (legs, knees, ankles, toes): None, normal, Trunk Movements Neck, shoulders, hips: None, normal, Overall Severity Severity of abnormal movements (highest score from questions above): None, normal Incapacitation due to abnormal movements: None, normal Patient's awareness of abnormal movements (rate only patient's report): No Awareness,    CIWA:  CIWA-Ar Total: 0 COWS:     Musculoskeletal: Strength & Muscle Tone: within normal limits Gait & Station: normal Patient leans: N/A  Psychiatric Specialty Exam: Review of Systems  Psychiatric/Behavioral: Positive for substance abuse. The patient is nervous/anxious.   All other systems reviewed and are negative.   Blood pressure 105/64, pulse 104, temperature 98.4 F (36.9 C), temperature source Oral, resp. rate 20, height  (1.651 m), weight 81.647 kg (180 lb), last menstrual period 07/22/2015, SpO2 98 %.Body mass index is 29.95 kg/(m^2).  General Appearance: Disheveled  Eye Solicitor::  Fair  Speech:  Slow  Volume:  Decreased  Mood:  Anxious improving  Affect:  Depressed  Thought Process:  Goal Directed  Orientation:  Full (Time, Place, and Person)  Thought Content:  Paranoid Ideation and Rumination improving  Suicidal Thoughts:  No  Homicidal Thoughts:  No  Memory:  Immediate;   Fair Recent;   Fair Remote;   Fair  Judgement:  Fair  Insight:  Fair  Psychomotor Activity:  Decreased  Concentration:   Fair  Recall:  [employer] of Knowledge:Fair  Language: Fair  Akathisia:  No  Handed:  Right  AIMS (if indicated):     Assets:  Desire for Improvement  ADL's:  Intact  Cognition: WNL  Sleep:  Number of Hours: 6.25   Treatment Plan Summary:Tara Yang is a 30 y.o.AA female, single , who lives in [place] , 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 Pt does not want any medications at this time other than Vistaril. Will continue treatment.    Daily contact with patient to assess and evaluate symptoms and progress in treatment and Medication management Reviewed past medical records,treatment plan. Reviewed Reece Leader LCSW notes in EHR. Will DC Zoloft 25 mg po daily for affective sx.Pt is refusing to take it. Will continue Vistaril 25 mg po tid prn for anxiety/sleep issues. Will continue CIWA protocol/ativan PRN for CIWA>/=10 - Pt was abusing alcohol on a daily basis. Will make available PRN medications as per agitation protocol. Will continue to monitor vitals ,medication compliance and treatment side effects while patient is here.  Will monitor for medical issues as well as call consult as needed.  Reviewed labs TSH - wnl, CT scan head - wnl ,will order as needed.  CSW will start working on disposition.  Patient to participate in therapeutic milieu .      Nakia Koble, MD 07/31/2015, 2:40 PM

## 2015-07-31 NOTE — Progress Notes (Signed)
D: Patient alert and oriented x 4. Patient denies pain/SI/HI/AVH. Patient states she is no longer paranoid. Patient reports she is getting everything set up for future discharge. Patient states she is going to parents home to stay and taking a leave of absent from her job. Patient prior to being admitted she had been working a lot of hours and not getting much sleep and she contributes that to reason for being admitted.  A: Staff to monitor Q 15 mins for safety. Encouragement and support offered. Scheduled medications administered per orders. R: Patient remains safe on the unit. Patient attended group tonight. Patient visible on hte unit and interacting with peers. Patient taking administered medications.

## 2015-07-31 NOTE — Progress Notes (Signed)
D: Pt calm and cooperative on approach. Pt presented with fair eye contact and positive body language during shift assessment. Pt hygiene appropriate for pt and situation. Pt denies depression, SI/HI and AVH. Pt denies feeing paranoid this morning and do not appear to be paranoid. Pt reports fair sleep and appetite. Pt compliant with attending groups. No complaints verbalized by pt.  A: Medications reviewed by Clinical research associate. Verbal support provided. Pt encouraged to attend groups. 15 minute checks performed for safety.  R: Pt receptive to tx.

## 2015-08-01 NOTE — Tx Team (Signed)
Interdisciplinary Treatment Plan Update (Adult) Date: 08/01/2015   Date: 08/01/2015 9:20 AM  Progress in Treatment:  Attending groups: Yes Participating in groups: Yes Taking medication as prescribed: Yes  Tolerating medication: Yes  Family/Significant othe contact made: Yes, with mother Patient understands diagnosis: Yes AEB seeking help Discussing patient identified problems/goals with staff: Yes  Medical problems stabilized or resolved: Yes  Denies suicidal/homicidal ideation: Yes Patient has not harmed self or Others: Yes   New problem(s) identified: None identified at this time.  Discharge Plan or Barriers: Pt may discharge to parents' home in [place] or live with siblings here in [place]; if Pt stays in [place], [place] may be an option  08/01/15: Pt will discharge home with mother to [place] and follow-up with [facility] program  Additional comments:  Patient and CSW reviewed pt's identified goals and treatment plan. Patient verbalized understanding and agreed to treatment plan. CSW reviewed [facility] "Discharge Process and Patient Involvement" Form. Pt verbalized understanding of information provided and signed form.   Medication Trial: Zoloft 73m (Pt declined medications)  Reason for Continuation of Hospitalization:  Depression Medication stabilization Psychotic- Auditory Hallucinations  Estimated length of stay: 0 days; Pt stable for DC  Review of initial/current patient goals per problem list:   1.  Goal(s): Patient will participate in aftercare plan  Met:  Yes  Target date: 3-5 days from date of admission   As evidenced by: Patient will participate within aftercare plan AEB aftercare provider and housing plan at discharge being identified.   08/01/15: [facility]  5.  Goal(s): Patient will demonstrate decreased signs of psychosis  . Met:  Yes . Target date: 3-5 days from  date of admission  . As evidenced by: Patient will demonstrate decreased frequency of AVH or return to baseline function   -07/29/15: Pt continues to be confused,   however orientation is improving. She was   recently admitted with AVH.   -08/01/15: Pt orientation has improved and she   denies AVH. She does not appear to be   responding to internal stimuli.  Attendees:  Patient:    Family:    Physician: Dr. EShea Evans MD  08/01/2015 9:20 AM  Nursing: JLars Pinks RN Case manager  08/01/2015 9:20 AM  Clinical Social Worker LPeri Maris LBrazoria3/07/2015 9:20 AM  Other: HManuella Ghazi RN; ELarrie Kass, RN 08/01/2015 9:20 AM  Clinical:  RN 08/01/2015 9:20 AM  Other: , RN Charge Nurse 08/01/2015 9:20 AM  Other:     LPeri Maris LScrevenSocial Work [phone]

## 2015-08-01 NOTE — Progress Notes (Signed)
  [facility] Adult Case Management Discharge Plan :  Will you be returning to the same living situation after discharge:  No. Pt will be staying with her mother at discharge At discharge, do you have transportation home?: Yes,  Pt mother to pick up Do you have the ability to pay for your medications: Yes,  Pt provided with prescriptions  Release of information consent forms completed and in the chart;  Patient's signature needed at discharge.  Patient to Follow up at: Follow-up Information    Follow up with [facility] On 08/08/2015.   Why:  at 10:00am for your initial assessment for the partial hospitalization program. Enter through the back parking lot and ask for Kim.   Contact information:   [address] [place] [address] [phone] Fax: [phone]      Next level of care provider has access to [facility] Link:no  Safety Planning and Suicide Prevention discussed: Yes,  with mother; see SPE note  Have you used any form of tobacco in the last 30 days? (Cigarettes, Smokeless Tobacco, Cigars, and/or Pipes): No  Has patient been referred to the Quitline?: N/A patient is not a smoker  Patient has been referred for addiction treatment: Yes- see above  Elaina Hoops 08/01/2015, 9:24 AM

## 2015-08-01 NOTE — Discharge Summary (Signed)
Physician Discharge Summary Note  Patient:  Tara Yang is an 30 y.o., female MRN:  [id number] DOB:  Feb 22, 1986 Patient phone:  [phone] (home)  Patient address:   [address] [place] [address],  Total Time spent with patient: 30 minutes  Date of Admission:  07/27/2015 Date of Discharge: 08/01/2015  Reason for Admission:     Principal Problem: MDD (major depressive disorder), single episode, severe with psychosis [facility]) Discharge Diagnoses: Patient Active Problem List   Diagnosis Date Noted  . Alcohol use disorder, moderate, dependence (HCC) [F10.20] 07/30/2015  . 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 PTSD (post-traumatic stress disorder) [F43.10] 07/29/2015  . Depression [F32.9] 07/29/2015   Past Psychiatric History:  See above noted  Past Medical History: History reviewed. No pertinent past medical history. History reviewed. No pertinent past surgical history. Family History:  Family History  Problem Relation Age of Onset  . Alcoholism Other    Family Psychiatric  History:  Denied Social History:  History  Alcohol Use  . 0.0 oz/week  . 0 Standard drinks or equivalent per week    Comment: unknown as pt not able to recall     History  Drug Use No    Comment: unknown as pt is not able to recall    Social History   Social History  . Marital Status: Single    Spouse Name: N/A  . Number of Children: N/A  . Years of Education: N/A   Social History Main Topics  . Smoking status: Former Games developer  . Smokeless tobacco: None  . Alcohol Use: 0.0 oz/week    0 Standard drinks or equivalent per week     Comment: unknown as pt not able to recall  . Drug Use: No     Comment: unknown as pt is not able to recall  . Sexual Activity: Yes    Birth Control/ Protection: Injection   Other Topics Concern  . None   Social History Narrative    Hospital Course:  Tara Yang was admitted for  MDD (major depressive disorder), single episode, severe with psychosis (HCC) and crisis management.  She was treated with the following medications, Vistaril 25 mg po tid prn for anxiety/sleep issues, completed CIWA protocol/ativan without event and PRN medications as per agitation protocol.   Tara Yang was discharged with current medication and was instructed on how to take medications as prescribed; (details listed below under Medication List).  Medical problems were identified and treated as needed.  Home medications were restarted as appropriate.  Improvement was monitored by observation and Tara Yang daily report of symptom reduction.  Emotional and mental status was monitored by daily self-inventory reports completed by Tara Yang and clinical staff.  Patient continued to improve, appeared calm.  She reported better sleep.  She has continued to refuse any medications, however, receptive to following up with outpatient clinic.        Tara Yang was evaluated by the treatment team for stability and plans for continued recovery upon discharge.  Tara Yang motivation was an integral factor for scheduling further treatment.  Employment, transportation, bed availability, health status, family support, and any pending legal issues were also considered during her hospital stay.  She was offered further treatment options upon discharge including but not limited to Residential, Intensive Outpatient, and Outpatient treatment.  Tara Yang will follow up with the services as listed below under Follow Up Information.  Upon completion of this admission the Tara Yang was both mentally and medically stable for discharge denying suicidal/homicidal ideation, auditory/visual/tactile hallucinations, delusional thoughts and paranoia.     Physical Findings: AIMS: Facial and Oral Movements Muscles of Facial Expression: None, normal Lips and Perioral Area: None, normal Jaw: None,  normal Tongue: None, normal,Extremity Movements Upper (arms, wrists, hands, fingers): None, normal Lower (legs, knees, ankles, toes): None, normal, Trunk Movements Neck, shoulders, hips: None, normal, Overall Severity Severity of abnormal movements (highest score from questions above): None, normal Incapacitation due to abnormal movements: None, normal Patient's awareness of abnormal movements (rate only patient's report): No Awareness,    CIWA:  CIWA-Ar Total: 0 COWS:     Musculoskeletal: Strength & Muscle Tone: within normal limits Gait & Station: normal Patient leans: N/A  Psychiatric Specialty Exam:  SEE MD SRA Review of Systems  All other systems reviewed and are negative.   Blood pressure 112/62, pulse 89, temperature 98.3 F (36.8 C), temperature source Oral, resp. rate 20, height  (1.651 m), weight 81.647 kg (180 lb), last menstrual period 07/22/2015, SpO2 98 %.Body mass index is 29.95 kg/(m^2).  Have you used any form of tobacco in the last 30 days? (Cigarettes, Smokeless Tobacco, Cigars, and/or Pipes): No  Has this patient used any form of tobacco in the last 30 days? (Cigarettes, Smokeless Tobacco, Cigars, and/or Pipes) Yes, N/A  Blood Alcohol level:  Lab Results  Component Value Date   ETH <5 07/26/2015    Metabolic Disorder Labs:  No results found for: HGBA1C, MPG No results found for: PROLACTIN Lab Results  Component Value Date   CHOL 158 07/21/2014   TRIG 56 07/21/2014   HDL 65 07/21/2014   CHOLHDL 2.4 07/21/2014   VLDL 11 07/21/2014   LDLCALC 82 07/21/2014    See Psychiatric Specialty Exam and Suicide Risk Assessment completed by Attending Physician prior to discharge.  Discharge destination:  Home  Is patient on multiple antipsychotic therapies at discharge:  No   Has Patient had three or more failed trials of antipsychotic monotherapy by history:  No  Recommended Plan for Multiple Antipsychotic Therapies: NA     Medication List    STOP  taking these medications        etonogestrel 68 MG Impl implant  Commonly known as:  NEXPLANON     levocetirizine 5 MG tablet  Commonly known as:  XYZAL     mometasone 50 MCG/ACT nasal spray  Commonly known as:  NASONEX     pseudoephedrine 30 MG tablet  Commonly known as:  SUDAFED           Follow-up Information    Follow up with [facility] On 08/08/2015.   Why:  at 10:00am for your initial assessment for the partial hospitalization program. Enter through the back parking lot and ask for Kim.   Contact information:   [address] [place] [address] [phone] Fax: [phone]      Follow-up recommendations:  Activity:  as tol Diet:  as tol  Comments:  1.  Take all your medications as prescribed.              2.  Report any adverse side effects to outpatient provider.                       3.  Patient instructed to not use alcohol or illegal drugs while on prescription medicines.  4.  In the event of worsening symptoms, instructed patient to call 911, the crisis hotline or go to nearest emergency room for evaluation of symptoms.  Signed: Lindwood Qua, NP [facility] 08/01/2015, 11:26 AM

## 2015-08-01 NOTE — Progress Notes (Signed)
D: Patient alert and oriented x 4. Patient denies pain/SI/HI/AVH. Patient complained of cough symptoms. PRN Tessalon and Sudafed given at 2012 with effective results. Patient stayed in dayroom most of evening talking with peers.  A: Staff to monitor Q 15 mins for safety. Encouragement and support offered. Scheduled medications administered per orders. R: Patient remains safe on the unit. Patient attended group tonight. Patient visible on hte unit and interacting with peers. Patient taking administered medications.

## 2015-08-01 NOTE — [facility] Group Notes (Signed)
[facility] Group Notes:  (Nursing/MHT/Case Management/Adjunct)  Date:  08/01/2015  Time:  10:15 am  Type of Therapy:  Nurse Education  Participation Level:  Active  Participation Quality:  Appropriate and Attentive  Affect:  Appropriate  Cognitive:  Appropriate  Insight:  Improving  Engagement in Group:  Developing/Improving and Engaged  Modes of Intervention:  Discussion and Education  Summary of Progress/Problems:  Group topic was Leisure and lifestyle changes.  Discussed making positive changes with leisure activities, trying to focus on positives, make changes with our negative feelings/ideas and importance of daily goals.  Tara Yang states that likes to do outdoor activities, ride motorcycle and play pool in her spare time.  She feels that a positive attribute is being Friendly.   [name] 08/01/2015, 11:46 AM

## 2015-08-01 NOTE — [facility] Suicide Risk Assessment (Signed)
[facility] Discharge Suicide Risk Assessment   Principal Problem: MDD (major depressive disorder), single episode, severe with psychosis [facility]) Discharge Diagnoses:  Patient Active Problem List   Diagnosis Date Noted  . Alcohol use disorder, moderate, dependence (HCC) [F10.20] 07/30/2015  . 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 PTSD (post-traumatic stress disorder) [F43.10] 07/29/2015  . Depression [F32.9] 07/29/2015    Total Time spent with patient: 30 minutes  Musculoskeletal: Strength & Muscle Tone: within normal limits Gait & Station: normal Patient leans: N/A  Psychiatric Specialty Exam: Review of Systems  Psychiatric/Behavioral: Positive for substance abuse. Negative for depression, suicidal ideas and hallucinations. The patient is not nervous/anxious and does not have insomnia.   All other systems reviewed and are negative.   Blood pressure 112/62, pulse 89, temperature 98.3 F (36.8 C), temperature source Oral, resp. rate 20, height  (1.651 m), weight 81.647 kg (180 lb), last menstrual period 07/22/2015, SpO2 98 %.Body mass index is 29.95 kg/(m^2).  General Appearance: Casual  Eye Contact::  Fair  Speech:  Clear and Coherent409  Volume:  Normal  Mood:  Euthymic  Affect:  Appropriate  Thought Process:  Coherent  Orientation:  Full (Time, Place, and Person)  Thought Content:  WDL  Suicidal Thoughts:  No  Homicidal Thoughts:  No  Memory:  Immediate;   Fair Recent;   Fair Remote;   Fair  Judgement:  Fair  Insight:  Shallow  Psychomotor Activity:  Normal  Concentration:  Fair  Recall:  [employer] of Knowledge:Fair  Language: Fair  Akathisia:  No  Handed:  Right  AIMS (if indicated):     Assets:  Desire for Improvement  Sleep:  Number of Hours: 6.5  Cognition: WNL  ADL's:  Intact   Mental Status Per Nursing Assessment::   On Admission:     Demographic Factors:  NA  Loss Factors: NA  Historical  Factors: Impulsivity  Risk Reduction Factors:   Positive social support  Continued Clinical Symptoms:  Alcohol/Substance Abuse/Dependencies  Cognitive Features That Contribute To Risk:  Thought constriction (tunnel vision)    Suicide Risk:  Minimal: No identifiable suicidal ideation.  Patients presenting with no risk factors but with morbid ruminations; may be classified as minimal risk based on the severity of the depressive symptoms  Follow-up Information    Follow up with [facility] On 08/08/2015.   Why:  at 10:00am for your initial assessment for the partial hospitalization program. Enter through the back parking lot and ask for Kim.   Contact information:   [address] [place] [address] [phone] Fax: [phone]      Plan Of Care/Follow-up recommendations:  Activity:  no restrictions Diet:  regular Tests:  as needed Other:  follow up with aftercare  Ayce Pietrzyk, MD 08/01/2015, 9:19 AM

## 2015-08-01 NOTE — Progress Notes (Signed)
Tara Yang has been up and visible on the unit.  Attending groups.  Minimal interaction with peers.  She didn't complete her self inventory after much encouragement.  She denies SI/HI or A/V hallucinations.  She denies any physical complaints.  She states that she is ready to leave and stay with her parents for awhile.  SShe was pleasant and cooperative.  Alert and oriented X 3.  Affect bright.  D/C follow up paperwork reviewed with pt and copy sent as well as prescriptions.  Belongings (from locker 56-3 pairs of PJ pants with strings) returned to pt. Q 15 min checks maintained until discharge.

## 2016-07-21 ENCOUNTER — Ambulatory Visit (INDEPENDENT_AMBULATORY_CARE_PROVIDER_SITE_OTHER): Payer: 59 | Admitting: Family Medicine

## 2016-07-21 VITALS — BP 128/80 | HR 78 | Temp 98.8°F | Resp 17 | Ht 64.0 in | Wt 206.0 lb

## 2016-07-21 DIAGNOSIS — Z131 Encounter for screening for diabetes mellitus: Secondary | ICD-10-CM

## 2016-07-21 DIAGNOSIS — Z113 Encounter for screening for infections with a predominantly sexual mode of transmission: Secondary | ICD-10-CM

## 2016-07-21 DIAGNOSIS — Z0000 Encounter for general adult medical examination without abnormal findings: Secondary | ICD-10-CM | POA: Diagnosis not present

## 2016-07-21 DIAGNOSIS — Z13220 Encounter for screening for lipoid disorders: Secondary | ICD-10-CM | POA: Diagnosis not present

## 2016-07-21 DIAGNOSIS — Z23 Encounter for immunization: Secondary | ICD-10-CM | POA: Diagnosis not present

## 2016-07-21 DIAGNOSIS — Z111 Encounter for screening for respiratory tuberculosis: Secondary | ICD-10-CM | POA: Diagnosis not present

## 2016-07-21 DIAGNOSIS — Z1329 Encounter for screening for other suspected endocrine disorder: Secondary | ICD-10-CM | POA: Diagnosis not present

## 2016-07-21 LAB — POC MICROSCOPIC URINALYSIS (UMFC): MUCUS RE: ABSENT

## 2016-07-21 LAB — POCT URINALYSIS DIP (MANUAL ENTRY)
Bilirubin, UA: NEGATIVE
Glucose, UA: NEGATIVE
Ketones, POC UA: NEGATIVE
LEUKOCYTES UA: NEGATIVE
NITRITE UA: NEGATIVE
PH UA: 6
PROTEIN UA: NEGATIVE
RBC UA: NEGATIVE
Spec Grav, UA: 1.025
UROBILINOGEN UA: 0.2

## 2016-07-21 NOTE — Patient Instructions (Addendum)
IF you received an x-ray today, you will receive an invoice from [facility]. Please contact [facility] at [phone] with questions or concerns regarding your invoice.   IF you received labwork today, you will receive an invoice from [facility]. Please contact [facility] at [phone] with questions or concerns regarding your invoice.   Our billing staff will not be able to assist you with questions regarding bills from these companies.  You will be contacted with the lab results as soon as they are available. The fastest way to get your results is to activate your My Chart account. Instructions are located on the last page of this paperwork. If you have not heard from us regarding the results in 2 weeks, please contact this office.     Exercising to Lose Weight Introduction Exercising can help you to lose weight. In order to lose weight through exercise, you need to do vigorous-intensity exercise. You can tell that you are exercising with vigorous intensity if you are breathing very hard and fast and cannot hold a conversation while exercising. Moderate-intensity exercise helps to maintain your current weight. You can tell that you are exercising at a moderate level if you have a higher heart rate and faster breathing, but you are still able to hold a conversation. How often should I exercise? Choose an activity that you enjoy and set realistic goals. Your health care provider can help you to make an activity plan that works for you. Exercise regularly as directed by your health care provider. This may include:  Doing resistance training twice each week, such as:  Push-ups.  Sit-ups.  Lifting weights.  Using resistance bands.  Doing a given intensity of exercise for a given amount of time. Choose from these options:  150 minutes of moderate-intensity exercise every week.  75 minutes of vigorous-intensity exercise every week.  A mix of moderate-intensity and  vigorous-intensity exercise every week. Children, pregnant women, people who are out of shape, people who are overweight, and older adults may need to consult a health care provider for individual recommendations. If you have any sort of medical condition, be sure to consult your health care provider before starting a new exercise program. What are some activities that can help me to lose weight?  Walking at a rate of at least 4.5 miles an hour.  Jogging or running at a rate of 5 miles per hour.  Biking at a rate of at least 10 miles per hour.  Lap swimming.  Roller-skating or in-line skating.  Cross-country skiing.  Vigorous competitive sports, such as football, basketball, and soccer.  Jumping rope.  Aerobic dancing. How can I be more active in my day-to-day activities?  Use the stairs instead of the elevator.  Take a walk during your lunch break.  If you drive, park your car farther away from work or school.  If you take public transportation, get off one stop early and walk the rest of the way.  Make all of your phone calls while standing up and walking around.  Get up, stretch, and walk around every 30 minutes throughout the day. What guidelines should I follow while exercising?  Do not exercise so much that you hurt yourself, feel dizzy, or get very short of breath.  Consult your health care provider prior to starting a new exercise program.  Wear comfortable clothes and shoes with good support.  Drink plenty of water while you exercise to prevent dehydration or heat stroke. Body water is lost during  exercise and must be replaced.  Work out until you breathe faster and your heart beats faster. This information is not intended to replace advice given to you by your health care provider. Make sure you discuss any questions you have with your health care provider. Document Released: 06/20/2010 Document Revised: 10/24/2015 Document Reviewed: 10/19/2013  2017  Elsevier  Exercising to Stay Healthy Introduction Exercising regularly is important. It has many health benefits, such as:  Improving your overall fitness, flexibility, and endurance.  Increasing your bone density.  Helping with weight control.  Decreasing your body fat.  Increasing your muscle strength.  Reducing stress and tension.  Improving your overall health. In order to become healthy and stay healthy, it is recommended that you do moderate-intensity and vigorous-intensity exercise. You can tell that you are exercising at a moderate intensity if you have a higher heart rate and faster breathing, but you are still able to hold a conversation. You can tell that you are exercising at a vigorous intensity if you are breathing much harder and faster and cannot hold a conversation while exercising. How often should I exercise? Choose an activity that you enjoy and set realistic goals. Your health care provider can help you to make an activity plan that works for you. Exercise regularly as directed by your health care provider. This may include:  Doing resistance training twice each week, such as:  Push-ups.  Sit-ups.  Lifting weights.  Using resistance bands.  Doing a given intensity of exercise for a given amount of time. Choose from these options:  150 minutes of moderate-intensity exercise every week.  75 minutes of vigorous-intensity exercise every week.  A mix of moderate-intensity and vigorous-intensity exercise every week. Children, pregnant women, people who are out of shape, people who are overweight, and older adults may need to consult a health care provider for individual recommendations. If you have any sort of medical condition, be sure to consult your health care provider before starting a new exercise program. What are some exercise ideas? Some moderate-intensity exercise ideas include:  Walking at a rate of 1 mile in 15  minutes.  Biking.  Hiking.  Golfing.  Dancing. Some vigorous-intensity exercise ideas include:  Walking at a rate of at least 4.5 miles per hour.  Jogging or running at a rate of 5 miles per hour.  Biking at a rate of at least 10 miles per hour.  Lap swimming.  Roller-skating or in-line skating.  Cross-country skiing.  Vigorous competitive sports, such as football, basketball, and soccer.  Jumping rope.  Aerobic dancing. What are some everyday activities that can help me to get exercise?  Yard work, such as:  Child psychotherapist.  Raking and bagging leaves.  Washing and waxing your car.  Pushing a stroller.  Shoveling snow.  Gardening.  Washing windows or floors. How can I be more active in my day-to-day activities?  Use the stairs instead of the elevator.  Take a walk during your lunch break.  If you drive, park your car farther away from work or school.  If you take public transportation, get off one stop early and walk the rest of the way.  Make all of your phone calls while standing up and walking around.  Get up, stretch, and walk around every 30 minutes throughout the day. What guidelines should I follow while exercising?  Do not exercise so much that you hurt yourself, feel dizzy, or get very short of breath.  Consult your health  care provider before starting a new exercise program.  Wear comfortable clothes and shoes with good support.  Drink plenty of water while you exercise to prevent dehydration or heat stroke. Body water is lost during exercise and must be replaced.  Work out until you breathe faster and your heart beats faster. This information is not intended to replace advice given to you by your health care provider. Make sure you discuss any questions you have with your health care provider. Document Released: 06/20/2010 Document Revised: 10/24/2015 Document Reviewed: 10/19/2013  2017 Elsevier

## 2016-07-21 NOTE — Progress Notes (Signed)
Tara Yang  MRN: [id number] DOB: [birth year]  Subjective:  Tara Yang  is a 31 y.o. female who presents for annual physical exam and work form completion . Patient works with disabled and mentally challenged adults.  Last dental exam: January 2018 Last vision exam: January 2018 Last pap smear: Normal 2016 Vaccinations Tetanus-request immunization today.  Concerns: Weight Gain-reports that she changed her diet since the beginning of the year.  She has given up red meat and eat primarily fish. Denies routine exercise. Trying achieve weight loss through diet primarily. Reports drinking 6-8 glasses of water daily.  STD Sexually active. Nexplanon for contraception. Requests STD testing today.   Patient Active Problem List   Diagnosis Date Noted  . Alcohol use disorder, moderate, dependence (HCC) 07/30/2015  . 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 PTSD (post-traumatic stress disorder) 07/29/2015  . Depression 07/29/2015    No Known Allergies  Social History   Social History  . Marital status: Single    Spouse name: N/A  . Number of children: N/A  . Years of education: N/A   Social History Main Topics  . Smoking status: Former Games developer  . Smokeless tobacco: Never Used  . Alcohol use 0.0 oz/week     Comment: unknown as pt not able to recall  . Drug use: No     Comment: unknown as pt is not able to recall  . Sexual activity: Yes    Birth control/ protection: Injection   Other Topics Concern  . None   Social History Narrative  . None    No past surgical history on file.  Family History  Problem Relation Age of Onset  . Alcoholism Other    Review of Systems   See HPI Objective:  BP 128/80 (BP Location: Right Arm, Patient Position: Sitting, Cuff Size: Normal)   Pulse 78   Temp 98.8 F (37.1 C) (Oral)   Resp 17   Ht 5\' 4"  (1.626 m)   Wt 206 lb (93.4 kg)   LMP 06/24/2016 (Approximate)   SpO2 98%   BMI  35.36 kg/m   Physical Exam  Constitutional: She is oriented to person, place, and time and well-developed, well-nourished, and in no distress.  HENT:  Head: Normocephalic and atraumatic.  Right Ear: External ear normal.  Left Ear: External ear normal.  Nose: Nose normal.  Mouth/Throat: Oropharynx is clear and moist.  Eyes: Conjunctivae and EOM are normal. Pupils are equal, round, and reactive to light.  Neck: Normal range of motion. Neck supple. No thyromegaly present.  Cardiovascular: Normal rate, normal heart sounds and intact distal pulses.   Pulmonary/Chest: Effort normal and breath sounds normal.  Abdominal: Soft. She exhibits no distension. There is no tenderness.  Genitourinary:  Genitourinary Comments: Obtained GYN care at gynecological office  Musculoskeletal: Normal range of motion. She exhibits no edema or tenderness.  Lymphadenopathy:    She has no cervical adenopathy.  Neurological: She is alert and oriented to person, place, and time. Gait normal. GCS score is 15.  Skin: Skin is warm and dry.  Psychiatric: Mood, memory, affect and judgment normal.    Visual Acuity Screening   Right eye Left eye Both eyes  Without correction:     With correction: 20/20 20/20 20/20     Assessment and Plan :  Discussed healthy lifestyle, diet, exercise, preventative care, vaccinations, and addressed patient's concerns.  Plan for follow up in 12 months . Otherwise, plan for specific conditions below.  1. Annual physical exam 2. Screening-pulmonary TB 3. Screen for STD (sexually transmitted disease) 4. Screening for diabetes mellitus 5. Lipid screening 6. Screening for thyroid disorder  Employment form completed and return to patient during visit. My chart access code text to patient mobile phone number. I will update your results to MyChart if activated, otherwise you will be notified via mail.   Godfrey PickKimberly S. Tiburcio PeaHarris, MSN, FNP-C [facility] at [facility] [phone]

## 2016-07-22 ENCOUNTER — Encounter: Payer: Self-pay | Admitting: Family Medicine

## 2016-07-22 LAB — CBC WITH DIFFERENTIAL/PLATELET
BASOS ABS: 0 10*3/uL (ref 0.0–0.2)
Basos: 0 %
EOS (ABSOLUTE): 0.1 10*3/uL (ref 0.0–0.4)
EOS: 1 %
HEMATOCRIT: 38.3 % (ref 34.0–46.6)
Hemoglobin: 13.2 g/dL (ref 11.1–15.9)
IMMATURE GRANULOCYTES: 0 %
Immature Grans (Abs): 0 10*3/uL (ref 0.0–0.1)
LYMPHS ABS: 2.5 10*3/uL (ref 0.7–3.1)
Lymphs: 40 %
MCH: 30.9 pg (ref 26.6–33.0)
MCHC: 34.5 g/dL (ref 31.5–35.7)
MCV: 90 fL (ref 79–97)
MONOS ABS: 0.2 10*3/uL (ref 0.1–0.9)
Monocytes: 4 %
NEUTROS PCT: 55 %
Neutrophils Absolute: 3.4 10*3/uL (ref 1.4–7.0)
PLATELETS: 214 10*3/uL (ref 150–379)
RBC: 4.27 x10E6/uL (ref 3.77–5.28)
RDW: 12.6 % (ref 12.3–15.4)
WBC: 6.3 10*3/uL (ref 3.4–10.8)

## 2016-07-22 LAB — COMPREHENSIVE METABOLIC PANEL
ALBUMIN: 4.8 g/dL (ref 3.5–5.5)
ALK PHOS: 53 IU/L (ref 39–117)
ALT: 18 IU/L (ref 0–32)
AST: 20 IU/L (ref 0–40)
Albumin/Globulin Ratio: 1.5 (ref 1.2–2.2)
BILIRUBIN TOTAL: 0.6 mg/dL (ref 0.0–1.2)
BUN / CREAT RATIO: 9 (ref 9–23)
BUN: 7 mg/dL (ref 6–20)
CHLORIDE: 100 mmol/L (ref 96–106)
CO2: 20 mmol/L (ref 18–29)
CREATININE: 0.79 mg/dL (ref 0.57–1.00)
Calcium: 9.9 mg/dL (ref 8.7–10.2)
GFR calc Af Amer: 115 (ref >59)
GFR calc non Af Amer: 100 (ref >59)
GLOBULIN, TOTAL: 3.2 (ref 1.5–4.5)
GLUCOSE: 83 mg/dL (ref 65–99)
Potassium: 3.6 mmol/L (ref 3.5–5.2)
SODIUM: 140 mmol/L (ref 134–144)
Total Protein: 8 g/dL (ref 6.0–8.5)

## 2016-07-22 LAB — LIPID PANEL
Chol/HDL Ratio: 2.3 (ref 0.0–4.4)
Cholesterol, Total: 148 mg/dL (ref 100–199)
HDL: 64 mg/dL (ref >39)
LDL Calculated: 63 (ref 0–99)
TRIGLYCERIDES: 107 mg/dL (ref 0–149)
VLDL Cholesterol Cal: 21 (ref 5–40)

## 2016-07-22 LAB — GC/CHLAMYDIA PROBE AMP
CHLAMYDIA, DNA PROBE: NEGATIVE
Neisseria gonorrhoeae by PCR: NEGATIVE

## 2016-07-22 LAB — RPR: RPR: NONREACTIVE

## 2016-07-22 LAB — TSH: TSH: 3.32 u[IU]/mL (ref 0.450–4.500)

## 2016-07-22 LAB — HIV ANTIBODY (ROUTINE TESTING W REFLEX): HIV Screen 4th Generation wRfx: NONREACTIVE

## 2016-07-24 ENCOUNTER — Encounter: Payer: Self-pay | Admitting: Family Medicine

## 2016-07-24 ENCOUNTER — Telehealth: Payer: Self-pay

## 2016-07-24 LAB — QUANTIFERON IN TUBE
QFT TB AG MINUS NIL VALUE: 0 [IU]/mL
QUANTIFERON MITOGEN VALUE: >10 IU/mL
QUANTIFERON NIL VALUE: 0.03 [IU]/mL
QUANTIFERON TB AG VALUE: 0.03 [IU]/mL
QUANTIFERON TB GOLD: NEGATIVE

## 2016-07-24 LAB — QUANTIFERON TB GOLD ASSAY (BLOOD)

## 2016-07-24 NOTE — Telephone Encounter (Signed)
Pt has gotten her lab results and would like to speak with someone about TB blood work    [employer] [phone]

## 2016-07-24 NOTE — Telephone Encounter (Signed)
This lab only resulted this morning. Patient message sent via Mychart notifying her result are available to view and print.   Thanks!

## 2016-07-24 NOTE — Telephone Encounter (Signed)
Tara Yang, the patient's quantiferon results have not been released and she wants to print them to take to work. Can you please release them on mychart?

## 2016-08-05 IMAGING — CR DG CHEST 1V
1 series · 1 of 1 positions shown · non-contrast
Comparison: None.

CLINICAL DATA: Screening for tuberculosis.

EXAM:
CHEST 1 VIEW

[PA]
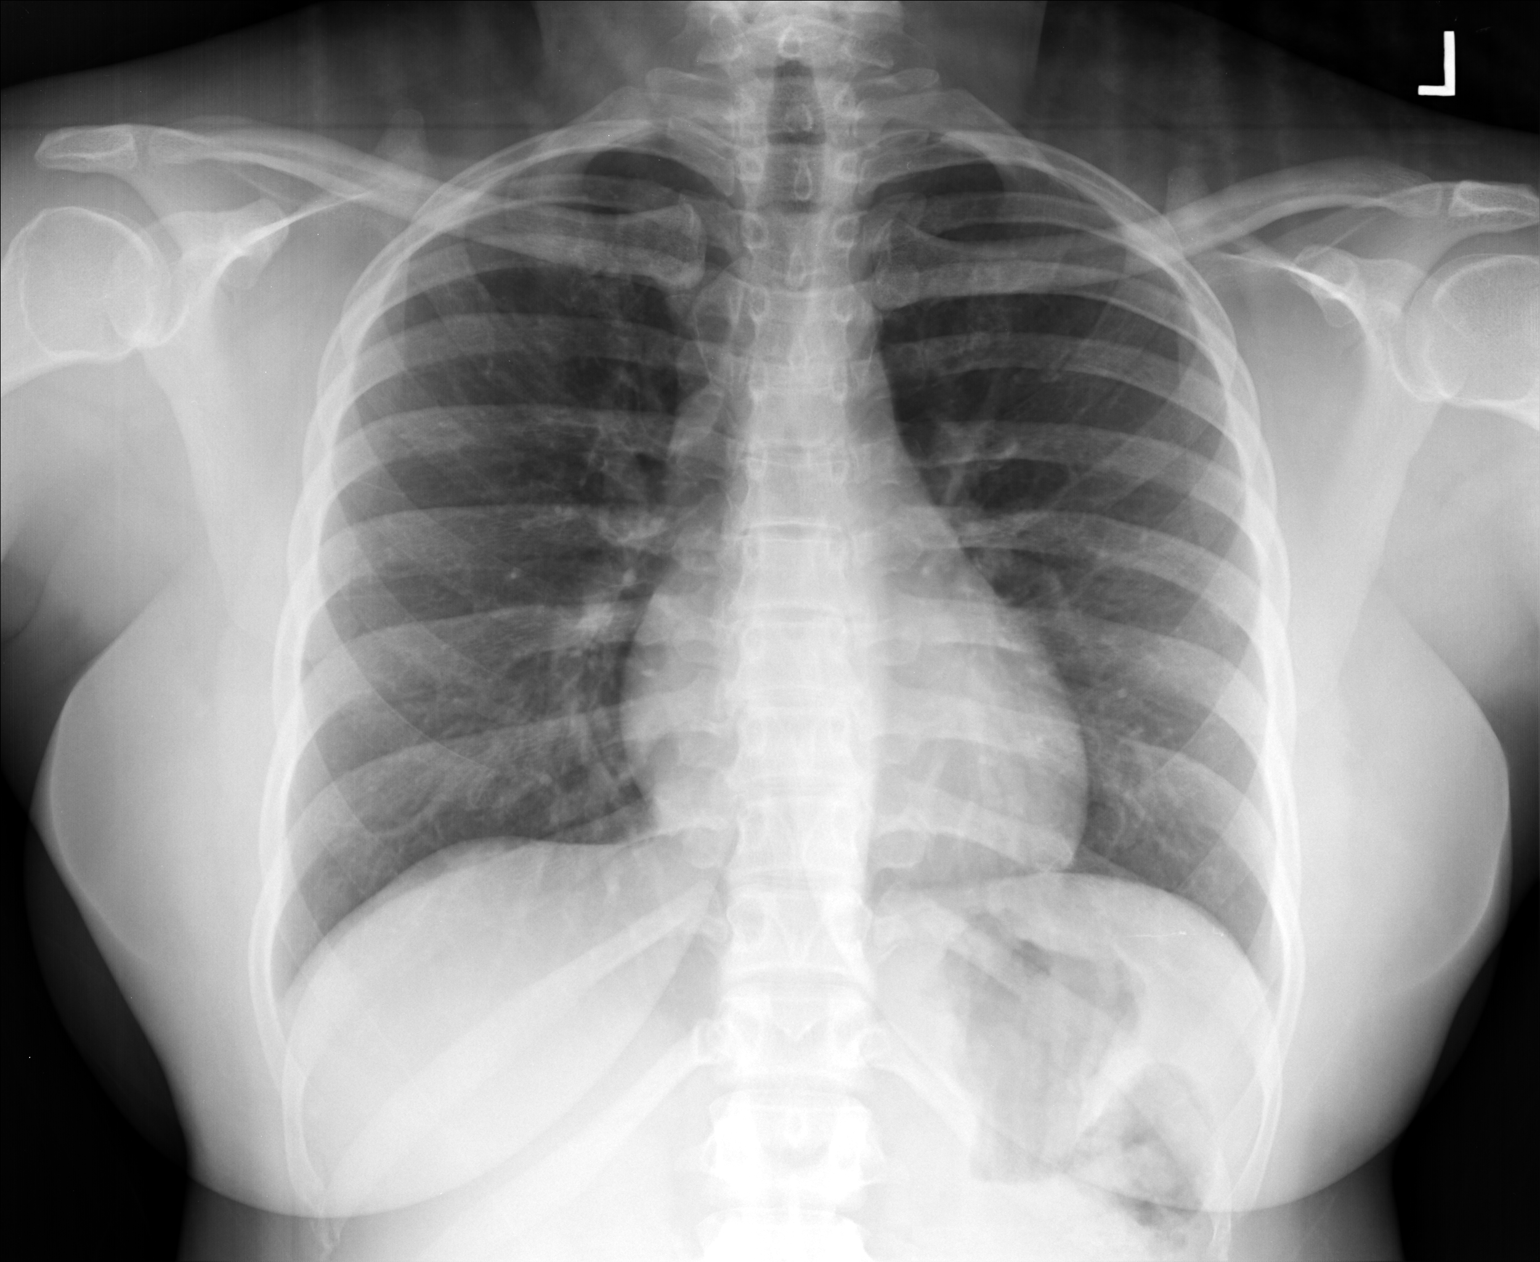

[1 of 1 positions shown; findings below may reference images not displayed]

FINDINGS: The heart size and mediastinal contours are within normal limits.
Both lungs are clear. The visualized skeletal structures are
unremarkable.
IMPRESSION: Normal chest x-ray.

## 2016-08-09 IMAGING — CT CT HEAD W/O CM
2 series · 16 of 30 positions shown, 20 images · non-contrast
Comparison: None.

CLINICAL DATA: Confusion, altered mental status with delayed
responses.

EXAM:
CT HEAD WITHOUT CONTRAST
TECHNIQUE: Contiguous axial images were obtained from the base of the skull
through the vertex without intravenous contrast.

[Series 3: head w/o · axial · non-contrast · 0.47mm/px · z∈[-144,+6]mm · 13 of 36 slices shown, 17 images]
[im 3/36  brain]
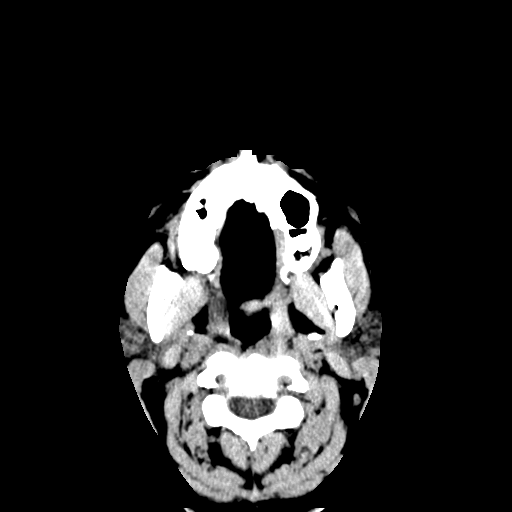
[im 3/36  bone]
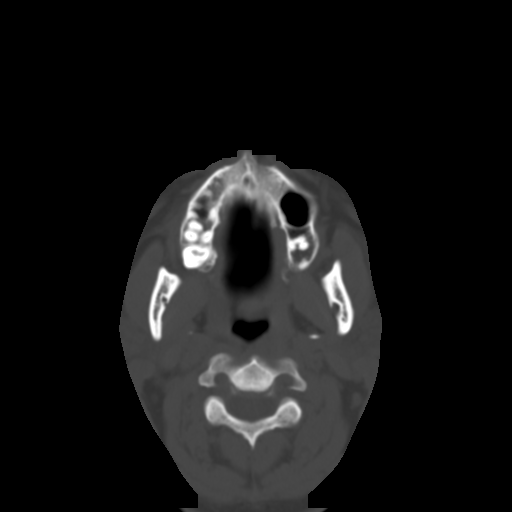
[im 6/36  brain]
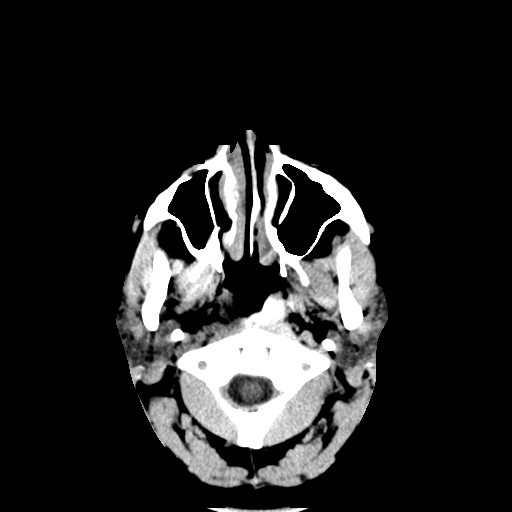
[im 8/36  brain]
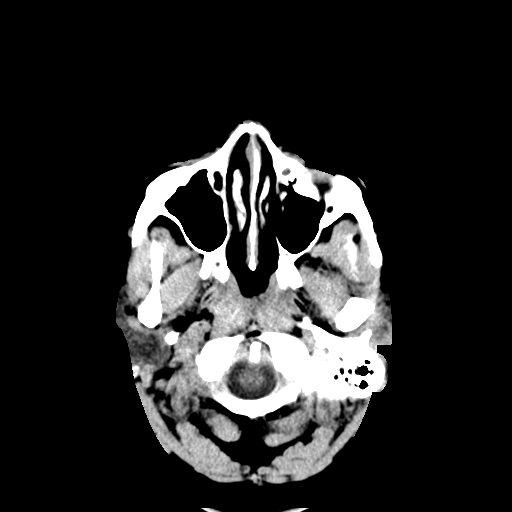
[im 11/36  brain]
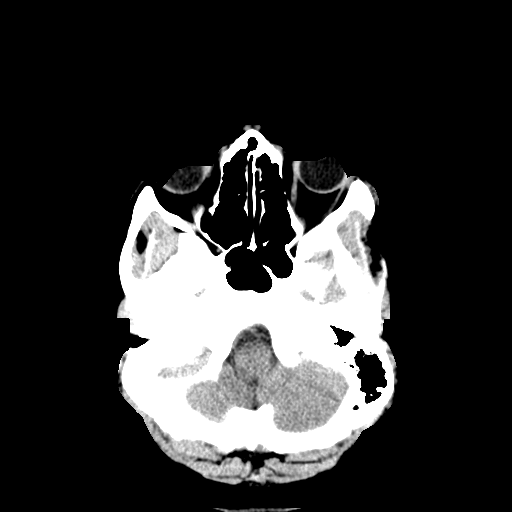
[im 13/36  brain]
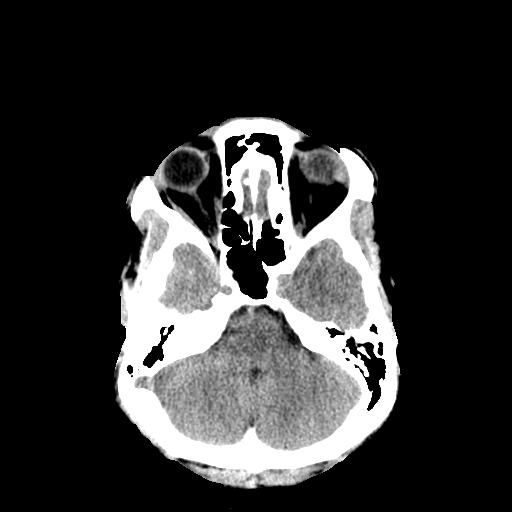
[im 13/36  bone]
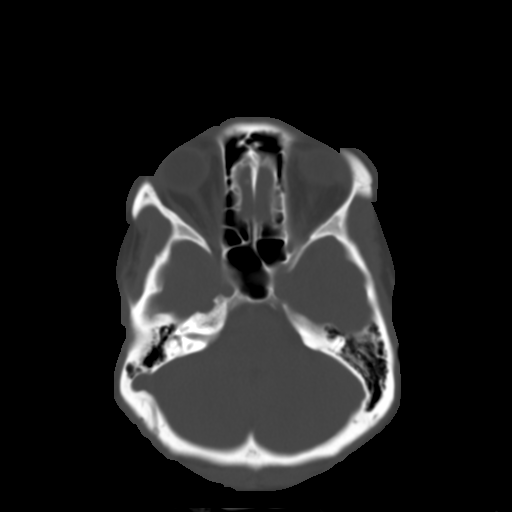
[im 16/36  brain]
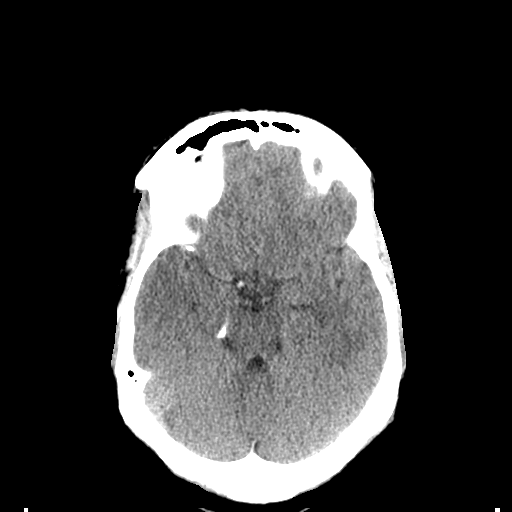
[im 18/36  brain]
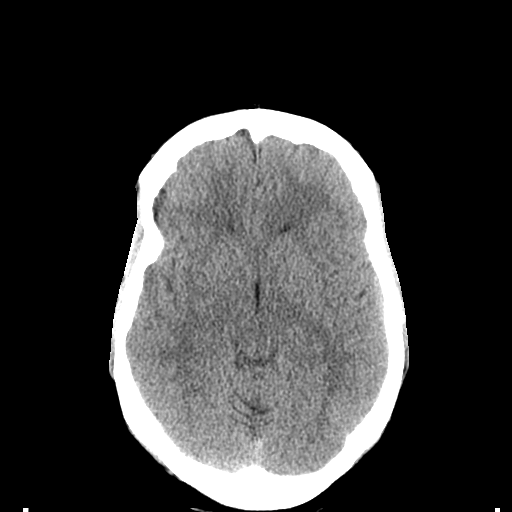
[im 21/36  brain]
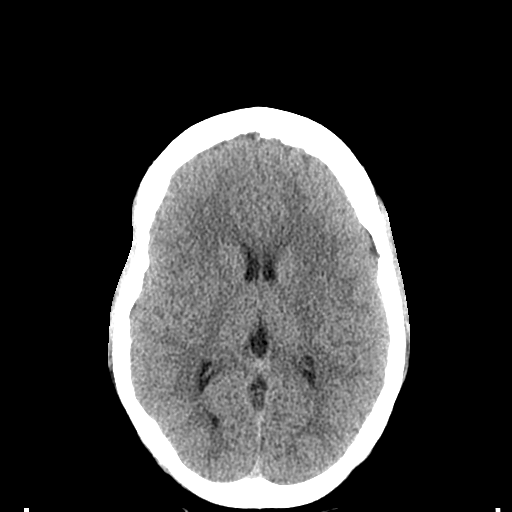
[im 23/36  brain]
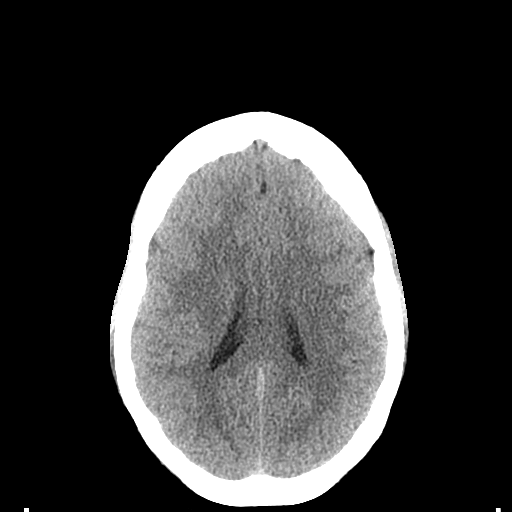
[im 23/36  bone]
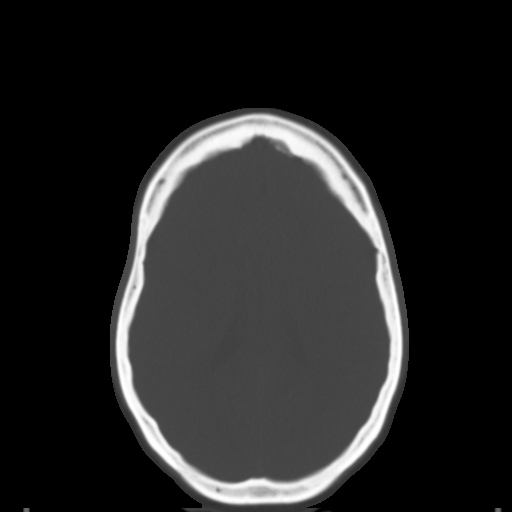
[im 26/36  brain]
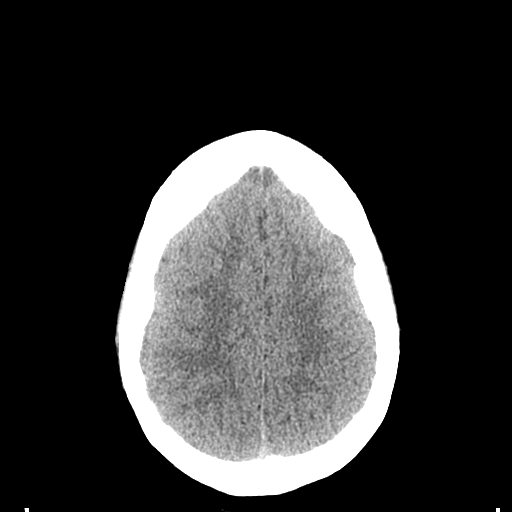
[im 28/36  brain]
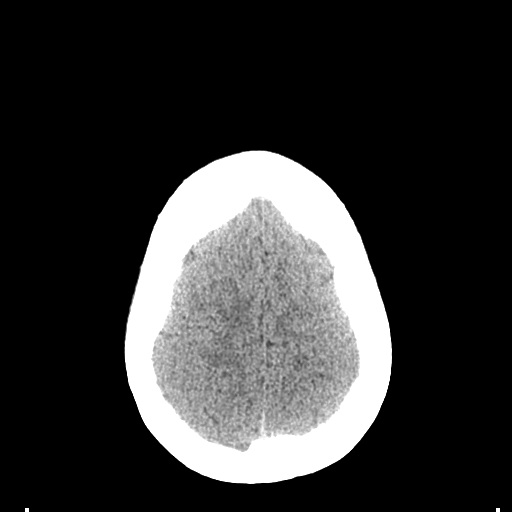
[im 31/36  brain]
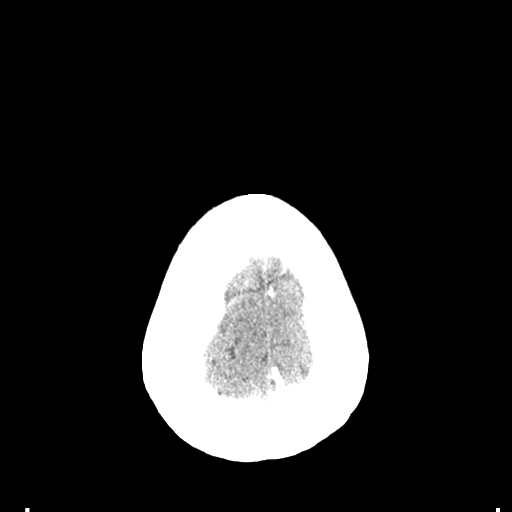
[im 33/36  brain]
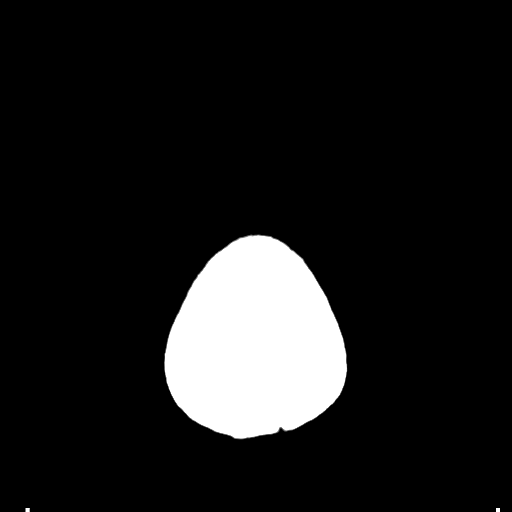
[im 33/36  bone]
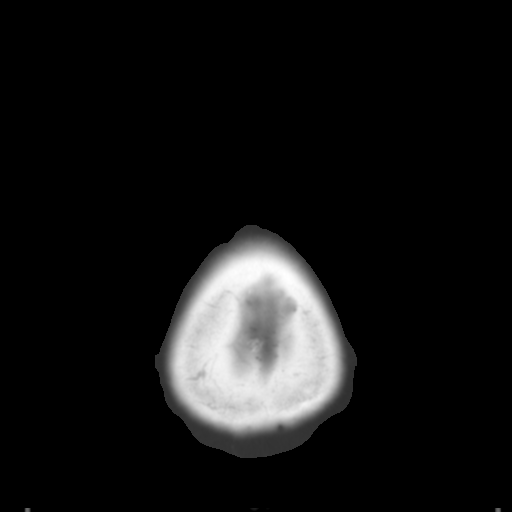

[Series 4: bone windows · axial · 0.47mm/px · z∈[-144,-94]mm · 3 of 36 slices shown]
[im 3/36  bone]
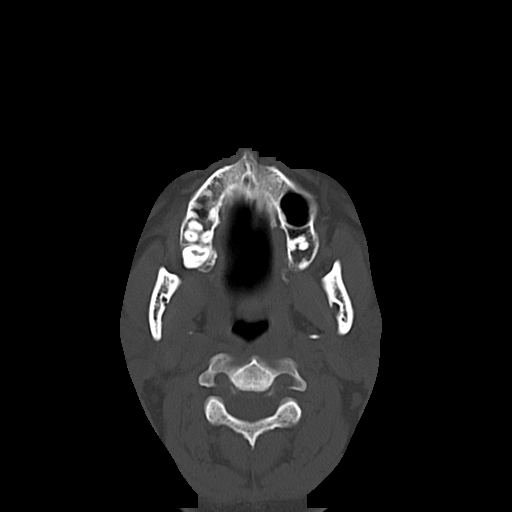
[im 8/36  bone]
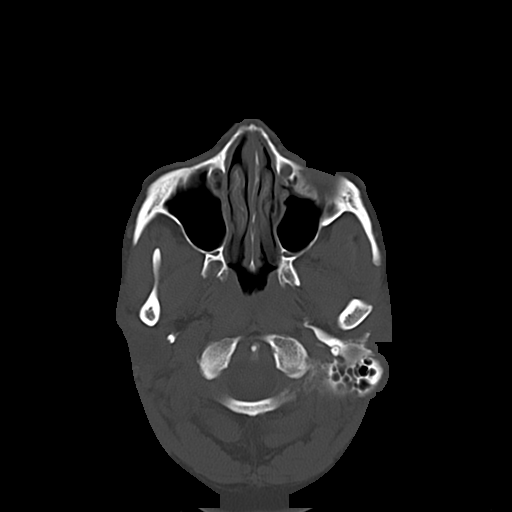
[im 13/36  bone]
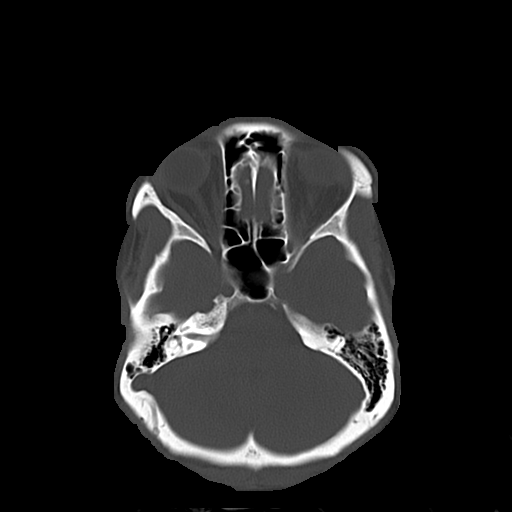

[16 of 30 positions shown; findings below may reference images not displayed]

FINDINGS: The ventricles and sulci are normal. No intraparenchymal hemorrhage,
mass effect nor midline shift. No acute large vascular territory
infarcts.

No abnormal extra-axial fluid collections. Basal cisterns are
patent. Probable subcentimeter pineal cyst.

No skull fracture. The included ocular globes and orbital contents
are non-suspicious. Small RIGHT maxillary mucosal retention cyst,
trace paranasal sinus mucosal thickening without air-fluid levels.
Mastoid air cells are well aerated.
IMPRESSION: No acute intracranial process.

Probable subcentimeter pineal cyst with otherwise negative CT head.

## 2016-10-12 ENCOUNTER — Ambulatory Visit (INDEPENDENT_AMBULATORY_CARE_PROVIDER_SITE_OTHER): Payer: 59 | Admitting: Emergency Medicine

## 2016-10-12 ENCOUNTER — Encounter: Payer: Self-pay | Admitting: Emergency Medicine

## 2016-10-12 VITALS — BP 111/62 | HR 78 | Temp 98.0°F | Resp 16 | Ht 64.0 in | Wt 202.5 lb

## 2016-10-12 DIAGNOSIS — R05 Cough: Secondary | ICD-10-CM

## 2016-10-12 DIAGNOSIS — R059 Cough, unspecified: Secondary | ICD-10-CM

## 2016-10-12 DIAGNOSIS — J0100 Acute maxillary sinusitis, unspecified: Secondary | ICD-10-CM | POA: Diagnosis not present

## 2016-10-12 DIAGNOSIS — J069 Acute upper respiratory infection, unspecified: Secondary | ICD-10-CM

## 2016-10-12 MED ORDER — AZITHROMYCIN 250 MG PO TABS: ORAL_TABLET | ORAL | 0 refills | Status: DC

## 2016-10-12 MED ORDER — PROMETHAZINE-CODEINE 6.25-10 MG/5ML PO SYRP: 5.0000 mL | ORAL_SOLUTION | Freq: Every evening | ORAL | 0 refills | Status: DC | PRN

## 2016-10-12 MED ORDER — FLUCONAZOLE 150 MG PO TABS: 150.0000 mg | ORAL_TABLET | Freq: Once | ORAL | 0 refills | Status: AC

## 2016-10-12 MED ORDER — PREDNISONE 20 MG PO TABS: 40.0000 mg | ORAL_TABLET | Freq: Every day | ORAL | 0 refills | Status: AC

## 2016-10-12 NOTE — Progress Notes (Signed)
Tara Yang 31 y.o.   Chief Complaint  Patient presents with  . URI    cough, chest congestion, sinus drainage, can't sleep, ST, green phlegm since last Monday     HISTORY OF PRESENT ILLNESS: [name] complaining of flu-like symptoms x 1 week.  URI   This is a new problem. The current episode started in the past 7 days. The problem has been gradually worsening. There has been no fever. Associated symptoms include congestion, coughing and sinus pain. Pertinent negatives include no abdominal pain, chest pain, diarrhea, dysuria, ear pain, headaches, joint pain, nausea, neck pain, rash, rhinorrhea, sore throat, swollen glands, vomiting or wheezing. She has tried antihistamine (Benadryl) for the symptoms.     Prior to Admission medications   Medication Sig Start Date End Date Taking? Authorizing Provider  Etonogestrel (NEXPLANON [place]) Inject into the skin.   Yes [provider]  MELATONIN ER PO Take by mouth.   Yes [provider]  Multiple Vitamins-Minerals (MULTIVITAMIN WITH MINERALS) tablet Take 1 tablet by mouth daily.   Yes [provider]  azithromycin (ZITHROMAX) 250 MG tablet Sig as indicated 10/12/16   Georgina Quint, MD  fluconazole (DIFLUCAN) 150 MG tablet Take 1 tablet (150 mg total) by mouth once. 10/12/16 10/12/16  Georgina Quint, MD  predniSONE (DELTASONE) 20 MG tablet Take 2 tablets (40 mg total) by mouth daily with breakfast. 10/12/16 10/17/16  Georgina Quint, MD  promethazine-codeine [facility] WITH CODEINE) 6.25-10 MG/5ML syrup Take 5 mLs by mouth at bedtime as needed for cough. 10/12/16   Georgina Quint, MD    No Known Allergies  Patient Active Problem List   Diagnosis Date Noted  . Alcohol use disorder, moderate, dependence (HCC) 07/30/2015  . 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 PTSD (post-traumatic stress disorder) 07/29/2015  . Depression  07/29/2015    History reviewed. No pertinent past medical history.  History reviewed. No pertinent surgical history.  Social History   Social History  . Marital status: Single    Spouse name: N/A  . Number of children: N/A  . Years of education: N/A   Occupational History  . Not on file.   Social History Main Topics  . Smoking status: Former Games developer  . Smokeless tobacco: Never Used  . Alcohol use 0.0 oz/week     Comment: unknown as pt not able to recall  . Drug use: No     Comment: unknown as pt is not able to recall  . Sexual activity: Yes    Birth control/ protection: Injection   Other Topics Concern  . Not on file   Social History Narrative  . No narrative on file    Family History  Problem Relation Age of Onset  . Alcoholism Other      Review of Systems  Constitutional: Negative.  Negative for chills, fever and malaise/fatigue.  HENT: Positive for congestion and sinus pain. Negative for ear pain, nosebleeds, rhinorrhea and sore throat.   Eyes: Negative for blurred vision, double vision, discharge and redness.  Respiratory: Positive for cough and sputum production. Negative for shortness of breath and wheezing.   Cardiovascular: Negative for chest pain, palpitations and leg swelling.  Gastrointestinal: Negative for abdominal pain, diarrhea, nausea and vomiting.  Genitourinary: Negative for dysuria.  Musculoskeletal: Negative for joint pain and neck pain.  Skin: Negative.  Negative for rash.  Neurological: Negative for dizziness, sensory change, focal weakness and headaches.  Endo/Heme/Allergies: Negative.   All other systems reviewed and are negative.    Vitals:   10/12/16 1652  BP: 111/62  Pulse: 78  Resp: 16  Temp: 98 F (36.7 C)     Physical Exam  Constitutional: She is oriented to person, place, and time. She appears well-developed and well-nourished.  HENT:  Head: Normocephalic and atraumatic.  Right Ear: External ear normal.  Left Ear:  External ear normal.  Nose: Right sinus exhibits maxillary sinus tenderness. Left sinus exhibits maxillary sinus tenderness.  Mouth/Throat: No oropharyngeal exudate.  Eyes: Conjunctivae and EOM are normal. Pupils are equal, round, and reactive to light.  Neck: Normal range of motion. Neck supple. No JVD present. No thyromegaly present.  Cardiovascular: Normal rate, regular rhythm and normal heart sounds.   Pulmonary/Chest: Effort normal and breath sounds normal.  Abdominal: Soft. She exhibits no distension. There is no tenderness.  Musculoskeletal: Normal range of motion.  Neurological: She is alert and oriented to person, place, and time. No sensory deficit. She exhibits normal muscle tone.  Skin: Skin is warm and dry. Capillary refill takes less than 2 seconds. No rash noted.  Psychiatric: She has a normal mood and affect. Her behavior is normal.  Vitals reviewed.    ASSESSMENT & PLAN: Tara Yang was seen today for uri.  Diagnoses and all orders for this visit:  Acute URI  Cough  Acute non-recurrent maxillary sinusitis  Other orders -     fluconazole (DIFLUCAN) 150 MG tablet; Take 1 tablet (150 mg total) by mouth once. -     azithromycin (ZITHROMAX) 250 MG tablet; Sig as indicated -     predniSONE (DELTASONE) 20 MG tablet; Take 2 tablets (40 mg total) by mouth daily with breakfast. -     promethazine-codeine (PHENERGAN WITH CODEINE) 6.25-10 MG/5ML syrup; Take 5 mLs by mouth at bedtime as needed for cough.    Patient Instructions       IF you received an x-ray today, you will receive an invoice from [facility]. Please contact [facility] at [phone] with questions or concerns regarding your invoice.   IF you received labwork today, you will receive an invoice from [facility]. Please contact [facility] at [phone] with questions or concerns regarding your invoice.   Our billing staff will not be able to assist you with questions regarding bills from  these companies.  You will be contacted with the lab results as soon as they are available. The fastest way to get your results is to activate your My Chart account. Instructions are located on the last page of this paperwork. If you have not heard from us regarding the results in 2 weeks, please contact this office.      Upper Respiratory Infection, Adult Most upper respiratory infections (URIs) are caused by a virus. A URI affects the nose, throat, and upper air passages. The most common type of URI is often called "the common cold." Follow these instructions at home:  Take medicines only as told by your doctor.  Gargle warm saltwater or take cough drops to comfort your throat as told by your doctor.  Use a warm mist humidifier or inhale steam from a shower to increase air moisture. This may make it easier to breathe.  Drink enough fluid to keep your pee (urine) clear or pale yellow.  Eat soups and other clear broths.  Have a healthy diet.  Rest as needed.  Go back to work when your fever is gone or your doctor says it  is okay.  You may need to stay home longer to avoid giving your URI to others.  You can also wear a face mask and wash your hands often to prevent spread of the virus.  Use your inhaler more if you have asthma.  Do not use any tobacco products, including cigarettes, chewing tobacco, or electronic cigarettes. If you need help quitting, ask your doctor. Contact a doctor if:  You are getting worse, not better.  Your symptoms are not helped by medicine.  You have chills.  You are getting more short of breath.  You have brown or red mucus.  You have yellow or brown discharge from your nose.  You have pain in your face, especially when you bend forward.  You have a fever.  You have puffy (swollen) neck glands.  You have pain while swallowing.  You have white areas in the back of your throat. Get help right away if:  You have very bad or  constant:  Headache.  Ear pain.  Pain in your forehead, behind your eyes, and over your cheekbones (sinus pain).  Chest pain.  You have long-lasting (chronic) lung disease and any of the following:  Wheezing.  Long-lasting cough.  Coughing up blood.  A change in your usual mucus.  You have a stiff neck.  You have changes in your:  Vision.  Hearing.  Thinking.  Mood. This information is not intended to replace advice given to you by your health care provider. Make sure you discuss any questions you have with your health care provider. Document Released: 11/04/2007 Document Revised: 01/19/2016 Document Reviewed: 08/23/2013 Elsevier Interactive Patient Education  2017 Elsevier Inc.      Edwina Barth, MD [facility]

## 2016-10-12 NOTE — Patient Instructions (Addendum)
     IF you received an x-ray today, you will receive an invoice from [facility]. Please contact [facility] at [phone] with questions or concerns regarding your invoice.   IF you received labwork today, you will receive an invoice from [facility]. Please contact [facility] at [phone] with questions or concerns regarding your invoice.   Our billing staff will not be able to assist you with questions regarding bills from these companies.  You will be contacted with the lab results as soon as they are available. The fastest way to get your results is to activate your My Chart account. Instructions are located on the last page of this paperwork. If you have not heard from us regarding the results in 2 weeks, please contact this office.      Upper Respiratory Infection, Adult Most upper respiratory infections (URIs) are caused by a virus. A URI affects the nose, throat, and upper air passages. The most common type of URI is often called "the common cold." Follow these instructions at home:  Take medicines only as told by your doctor.  Gargle warm saltwater or take cough drops to comfort your throat as told by your doctor.  Use a warm mist humidifier or inhale steam from a shower to increase air moisture. This may make it easier to breathe.  Drink enough fluid to keep your pee (urine) clear or pale yellow.  Eat soups and other clear broths.  Have a healthy diet.  Rest as needed.  Go back to work when your fever is gone or your doctor says it is okay.  You may need to stay home longer to avoid giving your URI to others.  You can also wear a face mask and wash your hands often to prevent spread of the virus.  Use your inhaler more if you have asthma.  Do not use any tobacco products, including cigarettes, chewing tobacco, or electronic cigarettes. If you need help quitting, ask your doctor. Contact a doctor if:  You are getting worse, not better.  Your  symptoms are not helped by medicine.  You have chills.  You are getting more short of breath.  You have brown or red mucus.  You have yellow or brown discharge from your nose.  You have pain in your face, especially when you bend forward.  You have a fever.  You have puffy (swollen) neck glands.  You have pain while swallowing.  You have white areas in the back of your throat. Get help right away if:  You have very bad or constant:  Headache.  Ear pain.  Pain in your forehead, behind your eyes, and over your cheekbones (sinus pain).  Chest pain.  You have long-lasting (chronic) lung disease and any of the following:  Wheezing.  Long-lasting cough.  Coughing up blood.  A change in your usual mucus.  You have a stiff neck.  You have changes in your:  Vision.  Hearing.  Thinking.  Mood. This information is not intended to replace advice given to you by your health care provider. Make sure you discuss any questions you have with your health care provider. Document Released: 11/04/2007 Document Revised: 01/19/2016 Document Reviewed: 08/23/2013 Elsevier Interactive Patient Education  2017 Elsevier Inc.  

## 2017-08-26 ENCOUNTER — Other Ambulatory Visit (HOSPITAL_COMMUNITY)
Admission: RE | Admit: 2017-08-26 | Discharge: 2017-08-26 | Disposition: A | Discharge status: Home / Self Care | Payer: 59 | Source: Ambulatory Visit | Attending: Nurse Practitioner | Admitting: Nurse Practitioner

## 2017-08-26 ENCOUNTER — Other Ambulatory Visit: Payer: Self-pay | Admitting: Nurse Practitioner

## 2017-08-26 DIAGNOSIS — Z01419 Encounter for gynecological examination (general) (routine) without abnormal findings: Secondary | ICD-10-CM | POA: Insufficient documentation

## 2017-08-30 LAB — CYTOLOGY - PAP
Chlamydia: NEGATIVE
Diagnosis: NEGATIVE
HPV: NOT DETECTED
NEISSERIA GONORRHEA: NEGATIVE

## 2018-01-10 ENCOUNTER — Other Ambulatory Visit: Payer: Self-pay

## 2018-01-10 ENCOUNTER — Encounter (HOSPITAL_COMMUNITY): Payer: Self-pay | Admitting: *Deleted

## 2018-01-10 DIAGNOSIS — Z87891 Personal history of nicotine dependence: Secondary | ICD-10-CM | POA: Diagnosis not present

## 2018-01-10 DIAGNOSIS — R443 Hallucinations, unspecified: Secondary | ICD-10-CM | POA: Insufficient documentation

## 2018-01-10 DIAGNOSIS — F4325 Adjustment disorder with mixed disturbance of emotions and conduct: Secondary | ICD-10-CM | POA: Insufficient documentation

## 2018-01-10 DIAGNOSIS — F329 Major depressive disorder, single episode, unspecified: Secondary | ICD-10-CM | POA: Diagnosis not present

## 2018-01-10 DIAGNOSIS — F1010 Alcohol abuse, uncomplicated: Secondary | ICD-10-CM | POA: Insufficient documentation

## 2018-01-10 DIAGNOSIS — F439 Reaction to severe stress, unspecified: Secondary | ICD-10-CM | POA: Diagnosis present

## 2018-01-10 NOTE — ED Triage Notes (Signed)
Pt request help with her ETOH, reports hx of ETOH abuse and PTSD. Denies SI. Last drink was July 14.

## 2018-01-11 ENCOUNTER — Inpatient Hospital Stay (HOSPITAL_COMMUNITY)
Admission: AD | Admit: 2018-01-11 | Discharge: 2018-01-18 | DRG: 885 | Disposition: A | Discharge status: Home / Self Care | Payer: 59 | Attending: Psychiatry | Admitting: Psychiatry

## 2018-01-11 ENCOUNTER — Emergency Department (HOSPITAL_COMMUNITY)
Admission: EM | Admit: 2018-01-11 | Discharge: 2018-01-11 | Disposition: A | Discharge status: Other Acute Inpatient Hospital | Payer: 59 | Attending: Emergency Medicine | Admitting: Emergency Medicine

## 2018-01-11 DIAGNOSIS — R45851 Suicidal ideations: Secondary | ICD-10-CM | POA: Diagnosis present

## 2018-01-11 DIAGNOSIS — F4325 Adjustment disorder with mixed disturbance of emotions and conduct: Secondary | ICD-10-CM | POA: Diagnosis not present

## 2018-01-11 DIAGNOSIS — F312 Bipolar disorder, current episode manic severe with psychotic features: Principal | ICD-10-CM | POA: Diagnosis present

## 2018-01-11 DIAGNOSIS — G4700 Insomnia, unspecified: Secondary | ICD-10-CM | POA: Diagnosis present

## 2018-01-11 DIAGNOSIS — Z87891 Personal history of nicotine dependence: Secondary | ICD-10-CM

## 2018-01-11 DIAGNOSIS — E876 Hypokalemia: Secondary | ICD-10-CM | POA: Diagnosis present

## 2018-01-11 DIAGNOSIS — F323 Major depressive disorder, single episode, severe with psychotic features: Secondary | ICD-10-CM | POA: Diagnosis present

## 2018-01-11 DIAGNOSIS — R443 Hallucinations, unspecified: Secondary | ICD-10-CM

## 2018-01-11 DIAGNOSIS — R4689 Other symptoms and signs involving appearance and behavior: Secondary | ICD-10-CM

## 2018-01-11 DIAGNOSIS — F419 Anxiety disorder, unspecified: Secondary | ICD-10-CM | POA: Diagnosis not present

## 2018-01-11 DIAGNOSIS — F23 Brief psychotic disorder: Secondary | ICD-10-CM

## 2018-01-11 DIAGNOSIS — F4310 Post-traumatic stress disorder, unspecified: Secondary | ICD-10-CM | POA: Diagnosis present

## 2018-01-11 HISTORY — DX: Anxiety disorder, unspecified: F41.9

## 2018-01-11 HISTORY — DX: Other specified health status: Z78.9

## 2018-01-11 HISTORY — DX: Post-traumatic stress disorder, unspecified: F43.10

## 2018-01-11 HISTORY — DX: Alcohol abuse, uncomplicated: F10.10

## 2018-01-11 LAB — COMPREHENSIVE METABOLIC PANEL
ALK PHOS: 50 U/L (ref 38–126)
ALT: 20 U/L (ref 0–44)
AST: 19 U/L (ref 15–41)
Albumin: 4.4 g/dL (ref 3.5–5.0)
Anion gap: 11 (ref 5–15)
CO2: 27 mmol/L (ref 22–32)
CREATININE: 0.72 mg/dL (ref 0.44–1.00)
Calcium: 9.6 mg/dL (ref 8.9–10.3)
Chloride: 103 mmol/L (ref 98–111)
GFR calc non Af Amer: >60 mL/min (ref >60)
Glucose, Bld: 101 mg/dL — ABNORMAL HIGH (ref 70–99)
Potassium: 3.1 mmol/L — ABNORMAL LOW (ref 3.5–5.1)
SODIUM: 141 mmol/L (ref 135–145)
Total Bilirubin: 0.9 mg/dL (ref 0.3–1.2)
Total Protein: 8.1 g/dL (ref 6.5–8.1)

## 2018-01-11 LAB — CBC WITH DIFFERENTIAL/PLATELET
BASOS PCT: 0 %
Basophils Absolute: 0 10*3/uL (ref 0.0–0.1)
Eosinophils Absolute: 0 10*3/uL (ref 0.0–0.7)
Eosinophils Relative: 0 %
HEMATOCRIT: 38.4 % (ref 36.0–46.0)
Hemoglobin: 13 g/dL (ref 12.0–15.0)
Lymphocytes Relative: 41 %
Lymphs Abs: 3.1 10*3/uL (ref 0.7–4.0)
MCH: 30.5 pg (ref 26.0–34.0)
MCHC: 33.9 g/dL (ref 30.0–36.0)
MCV: 90.1 fL (ref 78.0–100.0)
MONOS PCT: 7 %
Monocytes Absolute: 0.5 10*3/uL (ref 0.1–1.0)
NEUTROS ABS: 3.8 10*3/uL (ref 1.7–7.7)
NEUTROS PCT: 52 %
Platelets: 234 10*3/uL (ref 150–400)
RBC: 4.26 MIL/uL (ref 3.87–5.11)
RDW: 12.5 % (ref 11.5–15.5)
WBC: 7.5 10*3/uL (ref 4.0–10.5)

## 2018-01-11 LAB — SALICYLATE LEVEL: Salicylate Lvl: <7 mg/dL (ref 2.8–30.0)

## 2018-01-11 LAB — ACETAMINOPHEN LEVEL

## 2018-01-11 LAB — RAPID URINE DRUG SCREEN, HOSP PERFORMED
Amphetamines: NOT DETECTED
Barbiturates: NOT DETECTED
Benzodiazepines: NOT DETECTED
Cocaine: NOT DETECTED
OPIATES: NOT DETECTED
TETRAHYDROCANNABINOL: NOT DETECTED

## 2018-01-11 LAB — ETHANOL: Alcohol, Ethyl (B): <10 mg/dL (ref <10)

## 2018-01-11 LAB — I-STAT BETA HCG BLOOD, ED (MC, WL, AP ONLY)

## 2018-01-11 MED ORDER — ZIPRASIDONE MESYLATE 20 MG IM SOLR
20.0000 mg | Freq: Two times a day (BID) | INTRAMUSCULAR | Status: DC | PRN
  Administered 2018-01-11: 20 mg via INTRAMUSCULAR
  Filled 2018-01-11: qty 20

## 2018-01-11 MED ORDER — HYDROXYZINE HCL 25 MG PO TABS: 12.5000 mg | ORAL_TABLET | Freq: Three times a day (TID) | ORAL | Status: DC | PRN

## 2018-01-11 MED ORDER — HYDROXYZINE HCL 25 MG PO TABS
25.0000 mg | ORAL_TABLET | Freq: Three times a day (TID) | ORAL | Status: DC | PRN
  Administered 2018-01-11: 25 mg via ORAL
  Filled 2018-01-11: qty 1

## 2018-01-11 MED ORDER — LORAZEPAM 2 MG/ML IJ SOLN
2.0000 mg | Freq: Once | INTRAMUSCULAR | Status: AC
  Administered 2018-01-11: 2 mg via INTRAMUSCULAR
  Filled 2018-01-11: qty 1

## 2018-01-11 MED ORDER — STERILE WATER FOR INJECTION IJ SOLN
INTRAMUSCULAR | Status: AC
  Administered 2018-01-11: 10 mL
  Filled 2018-01-11: qty 10

## 2018-01-11 MED ORDER — POTASSIUM CHLORIDE CRYS ER 10 MEQ PO TBCR
10.0000 meq | EXTENDED_RELEASE_TABLET | Freq: Every day | ORAL | Status: DC
  Filled 2018-01-11: qty 1

## 2018-01-11 MED ORDER — MELATONIN 5 MG PO TABS
5.0000 mg | ORAL_TABLET | Freq: Every day | ORAL | Status: DC
  Filled 2018-01-11: qty 1

## 2018-01-11 MED ORDER — ACETAMINOPHEN 325 MG PO TABS: 650.0000 mg | ORAL_TABLET | ORAL | Status: DC | PRN

## 2018-01-11 MED ORDER — LORAZEPAM 1 MG PO TABS
1.0000 mg | ORAL_TABLET | Freq: Once | ORAL | Status: DC
  Filled 2018-01-11: qty 1

## 2018-01-11 MED ORDER — POTASSIUM CHLORIDE CRYS ER 20 MEQ PO TBCR
40.0000 meq | EXTENDED_RELEASE_TABLET | Freq: Once | ORAL | Status: AC
  Administered 2018-01-11: 40 meq via ORAL
  Filled 2018-01-11: qty 2

## 2018-01-11 MED ORDER — DIPHENHYDRAMINE HCL 50 MG/ML IJ SOLN
50.0000 mg | Freq: Two times a day (BID) | INTRAMUSCULAR | Status: DC | PRN
  Administered 2018-01-11: 50 mg via INTRAMUSCULAR
  Filled 2018-01-11: qty 1

## 2018-01-11 NOTE — ED Notes (Signed)
Pt has been anxious and agitated this morning, pacing the halls, intruding into other female patient's body space by touching them. Attention seeking with poor boundaries and difficult to redirect. Will not follow directions from staff.

## 2018-01-11 NOTE — ED Notes (Signed)
ED Provider at bedside. 

## 2018-01-11 NOTE — ED Notes (Signed)
Pt un-manageable and presenting danger to self and others. Grabbed the pen and clipboard away from nurse-practitioner and would not return it. Required assertive measures by staff to get it back to prevent injury to pt or others. Pt combative.

## 2018-01-11 NOTE — ED Provider Notes (Signed)
[facility]-EMERGENCY DEPT Provider Note   CSN: [id number] Arrival date & time: 01/10/18  2339     History   Chief Complaint Chief Complaint  Patient presents with  . Delusional  . Alcohol Problem  . Anxiety    HPI Tara Yang is a 32 y.o. female.   Tara Yang is a 32 y.o. Female with a history of PTSD, anxiety and alcohol use, who presents to the emergency department accompanied by her brother and sister for concern for alcohol withdrawal and abnormal behavior.  Patient is very slow to respond to my questions, but reports her last drink was on July 14, and before that she was drinking very heavily on a daily basis, she reports now she is having a lot of trouble with her PTSD.  Patient denies obvious suicidal or homicidal ideations and patient denies hallucinations, but her sibling were present at the bedside reports that patient seems to be intermittently responding to internal stimuli while at home.  They report that she has not been sleeping or eating well, and seems to be acting differently than normal, intermittently voicing that she wants to hurt her self or needs to herself.  Patient has not made any attempts to harm herself, denies any ingestions or drug use.  Family report that initially when the patient stopped drinking she was somewhat tremulous, but this seems to have improved and now her behavior just continues to be abnormal.  Patient reports she feels like she has a lot of racing thoughts that she does not know how to deal with and this is been keeping her up and she is unable to sleep.  She denies any focal medical complaints, no headaches, vision changes, chest pain, shortness of breath, abdominal pain, nausea or vomiting, or any pain in her extremities.     Past Medical History:  Diagnosis Date  . Alcohol abuse   . Anxiety   . PTSD (post-traumatic stress disorder)     Patient Active Problem List   Diagnosis Date Noted  . Alcohol use  disorder, moderate, dependence (HCC) 07/30/2015  . 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 PTSD (post-traumatic stress disorder) 07/29/2015  . Depression 07/29/2015    History reviewed. No pertinent surgical history.   OB History   None      Home Medications    Prior to Admission medications   Medication Sig Start Date End Date Taking? Authorizing Provider  hydrOXYzine (ATARAX/VISTARIL) 25 MG tablet Take 12.5-50 mg by mouth every 8 (eight) hours as needed for anxiety (insomnia).  01/10/18 01/10/19 Yes [provider]  Melatonin CR 3 MG TBCR Take 9 mg by mouth at bedtime.   Yes [provider]    Family History Family History  Problem Relation Age of Onset  . Alcoholism Other     Social History Social History   Tobacco Use  . Smoking status: Former Games developermoker  . Smokeless tobacco: Never Used  Substance Use Topics  . Alcohol use: Yes    Alcohol/week: 0.0 standard drinks    Comment: unknown as pt not able to recall  . Drug use: No    Comment: unknown as pt is not able to recall     Allergies   Patient has no known allergies.   Review of Systems Review of Systems  Constitutional: Negative for chills and fever.  HENT: Negative.   Eyes: Negative for visual disturbance.  Respiratory: Negative for cough and shortness of breath.  Cardiovascular: Negative for chest pain.  Gastrointestinal: Negative for abdominal pain, nausea and vomiting.  Genitourinary: Negative for dysuria and frequency.  Musculoskeletal: Negative for arthralgias and myalgias.  Skin: Negative for color change and rash.  Neurological: Negative for dizziness, tremors, seizures, syncope, light-headedness and headaches.  Psychiatric/Behavioral: Positive for behavioral problems, hallucinations and sleep disturbance. Negative for self-injury and suicidal ideas. The patient is nervous/anxious.      Physical Exam Updated Vital Signs BP  137/90   Pulse (!) 101   Temp 98.1 F (36.7 C) (Oral)   Resp (!) 23   Ht 5\' 3"  (1.6 m)   Wt 85.7 kg   LMP 12/19/2017   SpO2 100%   BMI 33.48 kg/m    Physical Exam  Constitutional: She is oriented to person, place, and time. She appears well-developed and well-nourished. No distress.  HENT:  Head: Normocephalic and atraumatic.  Mouth/Throat: Oropharynx is clear and moist.  Eyes: Pupils are equal, round, and reactive to light. EOM are normal. Right eye exhibits no discharge. Left eye exhibits no discharge.  Neck: Neck supple.  Cardiovascular: Normal rate, regular rhythm, normal heart sounds and intact distal pulses.  Pulmonary/Chest: Effort normal and breath sounds normal. No respiratory distress.  Respirations equal and unlabored, patient able to speak in full sentences, lungs clear to auscultation bilaterally  Abdominal: Soft. Bowel sounds are normal. She exhibits no distension and no mass. There is no tenderness. There is no guarding.  Respirations equal and unlabored, patient able to speak in full sentences, lungs clear to auscultation bilaterally  Musculoskeletal: She exhibits no edema or deformity.  Neurological: She is alert and oriented to person, place, and time. Coordination normal.  Speech is clear, able to follow commands CN III-XII intact Normal strength in upper and lower extremities bilaterally including dorsiflexion and plantar flexion, strong and equal grip strength Sensation normal to light and sharp touch Moves extremities without ataxia, coordination intact  Skin: Skin is warm and dry. Capillary refill takes less than 2 seconds. She is not diaphoretic.  Psychiatric: Her mood appears anxious. Her affect is blunt. Her speech is delayed. She is slowed and withdrawn. She is not actively hallucinating. She expresses no homicidal and no suicidal ideation. She expresses no suicidal plans and no homicidal plans.  Nursing note and vitals reviewed.    ED Treatments /  Results  Labs (all labs ordered are listed, but only abnormal results are displayed) Labs Reviewed  COMPREHENSIVE METABOLIC PANEL - Abnormal; Notable for the following components:      Result Value   Potassium 3.1 (*)    Glucose, Bld 101 (*)    BUN <5 (*)    All other components within normal limits  ACETAMINOPHEN LEVEL - Abnormal; Notable for the following components:   Acetaminophen (Tylenol), Serum <10 (*)    All other components within normal limits  ETHANOL  RAPID URINE DRUG SCREEN, HOSP PERFORMED  CBC WITH DIFFERENTIAL/PLATELET  SALICYLATE LEVEL  I-STAT BETA HCG BLOOD, ED ([facility], [facility], [facility] ONLY)    EKG EKG Interpretation  Date/Time:  Tuesday January 11 2018 01:36:45 EDT Ventricular Rate:  87 PR Interval:    QRS Duration: 83 QT Interval:  373 QTC Calculation: 449 R Axis:   40 Text Interpretation:  Sinus rhythm Borderline T abnormalities, anterior leads No significant change since last tracing Confirmed by Drema Pryardama, Pedro [phone]([id number]) on 01/11/2018 1:44:17 AM   Radiology No results found.  Procedures Procedures (including critical care time)  Medications Ordered in ED Medications  hydrOXYzine (  ATARAX/VISTARIL) tablet 12.5-50 mg (has no administration in time range)  Melatonin CR TBCR 9 mg (has no administration in time range)  potassium chloride (K-DUR,KLOR-CON) CR tablet 10 mEq (has no administration in time range)  acetaminophen (TYLENOL) tablet 650 mg (has no administration in time range)  potassium chloride SA (K-DUR,KLOR-CON) CR tablet 40 mEq (40 mEq Oral Given 01/11/18 0403)     Initial Impression / Assessment and Plan / ED Course  I have reviewed the triage vital signs and the nursing notes.  Pertinent labs & imaging results that were available during my care of the patient were reviewed by me and considered in my medical decision making (see chart for details).  Patient presents accompanied by her siblings for evaluation of abnormal behavior and increasing issues  with her PTSD after discontinuing the use of alcohol approximately 1 month ago.  Siblings report patient seems to be hallucinating intermittently, is not eating and drinking well and is not sleeping and has intermittently expressed some thoughts of hurting herself.  Patient denies any SI, HI or AVH at this time.  During the encounter her responses are delayed and she appears anxious.  No focal medical complaints, vitals stable here in the ED and no focal findings on exam.  Will get medical screening labs and EKG.  Alcohol level is negative, patient is not exhibiting any physiologic symptoms of alcohol withdrawal at this time and if she has not drank in a month as she reports I feel the symptoms she is experiencing are more likely an unmasking of what was going on while she was drinking.  Mild hypokalemia of 3.1, replaced with oral potassium here in the ED and will have patient continue with 10 mEq of potassium for the next 4 days.  No other acute electrode derangements, normal renal and liver function, no leukocytosis and normal hemoglobin.  Negative pregnancy.  Salicylate and acetaminophen levels are negative and UDS is clear.  At this time patient is medically cleared for TTS evaluation, placed under ED psych hold.  Awaiting TTS recommendations for appropriate disposition.  TTS recommends overnight observation for safety with reevaluation by psychiatry in the morning.  Final Clinical Impressions(s) / ED Diagnoses   Final diagnoses:  Abnormal behavior  Hallucinations  Hypokalemia    ED Discharge Orders    None      Legrand Rams 01/11/18 7829  Nira Conn, MD 01/11/18 [phone]

## 2018-01-11 NOTE — ED Notes (Signed)
[facility] [employer] contacted for patient transport to [facility]. 

## 2018-01-11 NOTE — ED Notes (Addendum)
[facility]. Tori, AC reported she would contact me back.

## 2018-01-11 NOTE — BH Assessment (Deleted)
[facility] Assessment Progress Note  Per Jacqueline Norman, DO, this pt requires psychiatric hospitalization.  Linsey Strader, RN, AC has assigned pt to [facility] Rm 403-2.  Pt presents under IVC initiated by pt's daughter, and upheld by EDP Pedro Cardama, MD, and IVC documents have been faxed to [facility].  Pt's nurse, Diane, has been notified, and agrees to call report to [phone].  Pt is to be transported via law enforcement.   Tyson Masin, MA Behavioral Health Coordinator [phone]     

## 2018-01-11 NOTE — ED Notes (Signed)
Pt placed in paper scrubs and belongings included: shirt, pants, flip flops, head scarf, and purse.

## 2018-01-11 NOTE — ED Notes (Signed)
[facility] [employer] for continuation of specialized care. Pt left in no acute distress. Patient refuse to sign for belongings and [facility]  [facility]. Pt left in no acute distress

## 2018-01-11 NOTE — Discharge Instructions (Signed)
° °  You will need to take potassium for the next 4 days, you should have this rechecked by your PCP in one week.

## 2018-01-11 NOTE — ED Notes (Signed)
Patient is cooperative but irritable at this time. Plan of care discussed. Encouragement and support provided and safety maintain. Q 15 min safety checks remain in place and video monitoring.
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
## 2018-01-11 NOTE — ED Notes (Signed)
Pt woke up and has been calm and cooperative.

## 2018-01-11 NOTE — ED Notes (Addendum)
Pt continuously trying to get into nurses station (shaking all door handles) and knocking on windows of nurses station. Pt is disruptive to milieu, yelling for nurses, mental health techs and police. When pt is assisted, she says nothing but just walks around in hallway of unit. Pt appears confused and distraught.

## 2018-01-11 NOTE — ED Notes (Signed)
Pt continuously pushing emergency button in room. Pt was assisted and asked what her emergency was. Pt stated  "I have emergency! I need to talk to the police! There is a sex offender on the loose." Pt was told she could use the phone after the doctor made her round at around 10am, but was urged not to call 911. She was told she could talk to the hospital police officer when they come to the unit. Pt continued to press emergency button. Pt appears confused, unable to understand directions for choosing a television channel.

## 2018-01-11 NOTE — ED Notes (Signed)
Pt press call bell, when writer went to check on pt. Pt seemed very confused stating that she hasn't been seen by the doctor but was aware of who the provider was.  Pt began asking for the provider by name.  Pt sts she wants to know "what she thinks".  RN notified.

## 2018-01-11 NOTE — ED Notes (Signed)
Patient denies SI/HI.AVH. Plan of care discussed. Encouragement and support provided and safety maintain. Q 15 min safety checks in place and video monitoring.  

## 2018-01-11 NOTE — BH Assessment (Signed)
[facility] Progress Note  Per Juanetta BeetsJacqueline Norman, DO, this pt requires psychiatric hospitalization.  Malva LimesLinsey Strader, RN, [facility] Rm [facility] in anticipation of a scheduled discharge; she will call [facility].  Dr Sharma CovertNorman also finds that pt meets criteria for IVC, which she has initiated.  [place], and at 12:35 Hortense RamalMagistrate Watts confirms receipt.  He has since faxed Findings and Custody Order to this Clinical research associatewriter.  At 13:16 I called [employer] and spoke to Lexmark Internationalperator [id number], who took demographic information, agreeing to dispatch law enforcement to fill out Return of Service.  Law enforcement then presented at [facility], completing Return of Service, after which [facility] documents were [facility] to [facility].  Pt's nurse, Diane, has been notified, and agrees to call report to [phone].  Pt is to be transported via Patent examinerlaw enforcement.   Doylene Canninghomas Noel Rodier, [place] Behavioral Health Coordinator [phone]

## 2018-01-11 NOTE — ED Notes (Signed)
Telemonitor at bedside  

## 2018-01-11 NOTE — ED Notes (Signed)
Pt pacing back and forward. Reaching in the nurses station window and hitting the window with an open hand. Pt going into other pt room. Pt tried to enter nurses station. Pt needing constant redirection to stay on her side of the hall. Pt was in room yelling and disrupting other pts.

## 2018-01-11 NOTE — ED Notes (Signed)
Pt wanded by security [facility]

## 2018-01-11 NOTE — BH Assessment (Signed)
Per Roosvelt HarpsJackie Norman, DO, this pt requires psychiatric hospitalization. Pt continues to present in ED with confusion & anxiety. She requires frequent redirection & protection from danger to self and others. [facility] by Cheron EveryLindsey Strader, RN, [facility]. 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

## 2018-01-12 ENCOUNTER — Encounter (HOSPITAL_COMMUNITY): Payer: Self-pay

## 2018-01-12 LAB — LIPID PANEL
CHOL/HDL RATIO: 3 ratio
Cholesterol: 146 mg/dL (ref 0–200)
HDL: 49 mg/dL (ref >40)
LDL CALC: 85 mg/dL (ref 0–99)
Triglycerides: 59 mg/dL (ref <150)
VLDL: 12 mg/dL (ref 0–40)

## 2018-01-12 LAB — TSH: TSH: 1.235 u[IU]/mL (ref 0.350–4.500)

## 2018-01-12 MED ORDER — LORAZEPAM 1 MG PO TABS: 1.0000 mg | ORAL_TABLET | Freq: Four times a day (QID) | ORAL | Status: DC | PRN

## 2018-01-12 MED ORDER — MAGNESIUM HYDROXIDE 400 MG/5ML PO SUSP: 30.0000 mL | Freq: Every day | ORAL | Status: DC | PRN

## 2018-01-12 MED ORDER — MELATONIN 5 MG PO TABS
5.0000 mg | ORAL_TABLET | Freq: Every day | ORAL | Status: DC
  Administered 2018-01-12 – 2018-01-17 (×6): 5 mg via ORAL
  Filled 2018-01-12 (×8): qty 1

## 2018-01-12 MED ORDER — ZIPRASIDONE MESYLATE 20 MG IM SOLR
20.0000 mg | Freq: Two times a day (BID) | INTRAMUSCULAR | Status: DC | PRN
  Administered 2018-01-12: 20 mg via INTRAMUSCULAR
  Filled 2018-01-12: qty 20

## 2018-01-12 MED ORDER — OLANZAPINE 5 MG PO TABS
5.0000 mg | ORAL_TABLET | Freq: Every day | ORAL | Status: DC
  Administered 2018-01-12 – 2018-01-17 (×6): 5 mg via ORAL
  Filled 2018-01-12 (×2): qty 1
  Filled 2018-01-12: qty 2
  Filled 2018-01-12 (×6): qty 1

## 2018-01-12 MED ORDER — ALUM & MAG HYDROXIDE-SIMETH 200-200-20 MG/5ML PO SUSP: 30.0000 mL | ORAL | Status: DC | PRN

## 2018-01-12 MED ORDER — SERTRALINE HCL 50 MG PO TABS
50.0000 mg | ORAL_TABLET | Freq: Every day | ORAL | Status: DC
  Administered 2018-01-12 – 2018-01-18 (×7): 50 mg via ORAL
  Filled 2018-01-12 (×10): qty 1

## 2018-01-12 MED ORDER — DIPHENHYDRAMINE HCL 50 MG/ML IJ SOLN
50.0000 mg | Freq: Two times a day (BID) | INTRAMUSCULAR | Status: DC | PRN
  Administered 2018-01-12: 50 mg via INTRAMUSCULAR
  Filled 2018-01-12: qty 1

## 2018-01-12 MED ORDER — OLANZAPINE 5 MG PO TBDP: 5.0000 mg | ORAL_TABLET | Freq: Three times a day (TID) | ORAL | Status: DC | PRN

## 2018-01-12 MED ORDER — HYDROXYZINE HCL 25 MG PO TABS: 25.0000 mg | ORAL_TABLET | Freq: Three times a day (TID) | ORAL | Status: DC | PRN

## 2018-01-12 MED ORDER — POTASSIUM CHLORIDE CRYS ER 10 MEQ PO TBCR
10.0000 meq | EXTENDED_RELEASE_TABLET | Freq: Every day | ORAL | Status: AC
  Administered 2018-01-12 – 2018-01-14 (×3): 10 meq via ORAL
  Filled 2018-01-12 (×4): qty 1

## 2018-01-12 MED ORDER — TRAZODONE HCL 50 MG PO TABS
50.0000 mg | ORAL_TABLET | Freq: Every evening | ORAL | Status: DC | PRN
  Administered 2018-01-12 – 2018-01-14 (×4): 50 mg via ORAL
  Filled 2018-01-12: qty 1
  Filled 2018-01-12: qty 10

## 2018-01-12 MED ORDER — ACETAMINOPHEN 325 MG PO TABS: 650.0000 mg | ORAL_TABLET | Freq: Four times a day (QID) | ORAL | Status: DC | PRN

## 2018-01-12 MED ORDER — HYDROXYZINE HCL 50 MG PO TABS: 50.0000 mg | ORAL_TABLET | Freq: Four times a day (QID) | ORAL | Status: DC | PRN

## 2018-01-12 NOTE — Progress Notes (Signed)
Patient denies SI, HI and AVH.  Patient has been engaged in groups, compliant with medications and has had no incidents of behavioral dsycontrol this shift.   Assess patient for safety, offer medications as prescribed, and engage patient in 1:1 staff talk.   Patient able to contract for safety.  Continue to monitor as planned. 

## 2018-01-12 NOTE — Tx Team (Signed)
Interdisciplinary Treatment and Diagnostic Plan Update  01/12/2018 Time of Session: 8:54 AM  Tara Yang MRN: [id number]  Principal Diagnosis: PTSD (post-traumatic stress disorder)  Secondary Diagnoses: Active Problems:   Brief psychotic disorder ([place])   Current Medications:  Current Facility-Administered Medications  Medication Dose Route Frequency Provider Last Rate Last Dose  . acetaminophen (TYLENOL) tablet 650 mg  650 mg Oral Q6H PRN Nanci Pina, FNP      . alum & mag hydroxide-simeth (MAALOX/MYLANTA) 200-200-20 MG/5ML suspension 30 mL  30 mL Oral Q4H PRN Nanci Pina, FNP      . ziprasidone (GEODON) injection 20 mg  20 mg Intramuscular BID PRN Nanci Pina, FNP   20 mg at 01/12/18 0016   And  . diphenhydrAMINE (BENADRYL) injection 50 mg  50 mg Intramuscular BID PRN Nanci Pina, FNP   50 mg at 01/12/18 0016  . hydrOXYzine (ATARAX/VISTARIL) tablet 25 mg  25 mg Oral TID PRN Nanci Pina, FNP      . OLANZapine zydis (ZYPREXA) disintegrating tablet 5 mg  5 mg Oral Q8H PRN Nanci Pina, FNP       And  . LORazepam (ATIVAN) tablet 1 mg  1 mg Oral Q6H PRN Starkes, Takia S, FNP      . magnesium hydroxide (MILK OF MAGNESIA) suspension 30 mL  30 mL Oral Daily PRN Nanci Pina, FNP      . Melatonin TABS 5 mg  5 mg Oral QHS Nanci Pina, FNP      . potassium chloride (K-DUR,KLOR-CON) CR tablet 10 mEq  10 mEq Oral Daily Starkes, Takia S, FNP        PTA Medications: Medications Prior to Admission  Medication Sig Dispense Refill Last Dose  . hydrOXYzine (ATARAX/VISTARIL) 25 MG tablet Take 12.5-50 mg by mouth every 8 (eight) hours as needed for anxiety (insomnia).    01/10/2018 at Unknown time  . Melatonin CR 3 MG TBCR Take 9 mg by mouth at bedtime.   Past Week at Unknown time    Patient Stressors: Educational concerns Health problems Substance abuse  Patient Strengths: Scientist, research (life sciences) Supportive family/friends  Treatment Modalities: Medication  Management, Group therapy, Case management,  1 to 1 session with clinician, Psychoeducation, Recreational therapy.   Physician Treatment Plan for Primary Diagnosis: PTSD (post-traumatic stress disorder) Long Term Goal(s): Improvement in symptoms so as ready for discharge  Short Term Goals:    Medication Management: Evaluate patient's response, side effects, and tolerance of medication regimen.  Therapeutic Interventions: 1 to 1 sessions, Unit Group sessions and Medication administration.  Evaluation of Outcomes: Progressing  Physician Treatment Plan for Secondary Diagnosis: Active Problems:   Brief psychotic disorder ([place])   Long Term Goal(s): Improvement in symptoms so as ready for discharge  Short Term Goals:    Medication Management: Evaluate patient's response, side effects, and tolerance of medication regimen.  Therapeutic Interventions: 1 to 1 sessions, Unit Group sessions and Medication administration.  Evaluation of Outcomes: Progressing   RN Treatment Plan for Primary Diagnosis: PTSD (post-traumatic stress disorder) Long Term Goal(s): Knowledge of disease and therapeutic regimen to maintain health will improve  Short Term Goals: Ability to identify and develop effective coping behaviors will improve and Compliance with prescribed medications will improve  Medication Management: RN will administer medications as ordered by provider, will assess and evaluate patient's response and provide education to patient for prescribed medication. RN will report any adverse and/or side effects to prescribing provider.  Therapeutic Interventions: 1  on 1 counseling sessions, Psychoeducation, Medication administration, Evaluate responses to treatment, Monitor vital signs and CBGs as ordered, Perform/monitor CIWA, COWS, AIMS and Fall Risk screenings as ordered, Perform wound care treatments as ordered.  Evaluation of Outcomes: Progressing   LCSW Treatment Plan for Primary Diagnosis:  PTSD (post-traumatic stress disorder) Long Term Goal(s): Safe transition to appropriate next level of care at discharge, Engage patient in therapeutic group addressing interpersonal concerns.  Short Term Goals: Engage patient in aftercare planning with referrals and resources  Therapeutic Interventions: Assess for all discharge needs, 1 to 1 time with Social worker, Explore available resources and support systems, Assess for adequacy in community support network, Educate family and significant other(s) on suicide prevention, Complete Psychosocial Assessment, Interpersonal group therapy.  Evaluation of Outcomes: Met  Return home, follow up outpt   Progress in Treatment: Attending groups: Yes Participating in groups: Yes Taking medication as prescribed: Yes Toleration medication: Yes, no side effects reported at this time Family/Significant other contact made: No Patient understands diagnosis: Yes AEB asking for hep for dealing with trauma Discussing patient identified problems/goals with staff: Yes Medical problems stabilized or resolved: Yes Denies suicidal/homicidal ideation: Yes Issues/concerns per patient self-inventory: None Other: N/A  New problem(s) identified: None identified at this time.   New Short Term/Long Term Goal(s): "I would like to build self esteem and relax as I deal with trauma."  Discharge Plan or Barriers:   Reason for Continuation of Hospitalization: Anxiety PTSD symptoms  Depression Hallucinations  Medication stabilization   Estimated Length of Stay: 8/19  Attendees: Patient: Tara Yang 01/12/2018  8:54 AM  Physician: Maris Berger, MD 01/12/2018  8:54 AM  Nursing: Elesa Massed, RN 01/12/2018  8:54 AM  RN Care Manager: Lars Pinks, RN 01/12/2018  8:54 AM  Social Worker: Ripley Fraise 01/12/2018  8:54 AM  Recreational Therapist: Winfield Cunas 01/12/2018  8:54 AM  Other: Norberto Sorenson 01/12/2018  8:54 AM  Other:  01/12/2018  8:54 AM     Scribe for Treatment Team:  Roque Lias LCSW 01/12/2018 8:54 AM

## 2018-01-12 NOTE — Tx Team (Signed)
Initial Treatment Plan 01/12/2018 1:29 AM Tara Yang WUJ:[id number]    PATIENT STRESSORS: Educational concerns Health problems Substance abuse   PATIENT STRENGTHS: Financial means Supportive family/friends   PATIENT IDENTIFIED PROBLEMS: "I need help communicating my needs"  "I want to not be so paranoid of men."                   DISCHARGE CRITERIA:  Ability to meet basic life and health needs Adequate post-discharge living arrangements Improved stabilization in mood, thinking, and/or behavior Medical problems require only outpatient monitoring  PRELIMINARY DISCHARGE PLAN: Attend aftercare/continuing care group Attend PHP/IOP Outpatient therapy  PATIENT/FAMILY INVOLVEMENT: This treatment plan has been presented to and reviewed with the patient, Tara Yang.  The patient and family have been given the opportunity to ask questions and make suggestions.  Jonetta SpeakAshton E Damali Broadfoot, RN 01/12/2018, 1:29 AM
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
## 2018-01-12 NOTE — Progress Notes (Signed)
Nursing Progress Note: 7p-7a D: Pt currently presents with a isolative/tangential/guarded/concrete/paranoid affect and behavior. Interacting minimally with the milieu. Pt reports good sleep during the previous night with current medication regimen. Pt did attend wrap-up group.  A: Pt provided with medications per providers orders. Pt's labs and vitals were monitored throughout the night. Pt supported emotionally and encouraged to express concerns and questions. Pt educated on medications.  R: Pt's safety ensured with 15 minute and environmental checks. Pt currently denies SI, HI, and AVH but is interacting with internal stimuli. Pt verbally contracts to seek staff if SI,HI, or AVH occurs and to consult with staff before acting on any harmful thoughts. Will continue to monitor.

## 2018-01-12 NOTE — [facility] Suicide Risk Assessment (Signed)
[facility]   Nursing information obtained from:  Patient Demographic factors:  Low socioeconomic status, Living alone Current Mental Status:  NA Loss Factors:  NA Historical Factors:  Impulsivity Risk Reduction Factors:  NA  Total Time spent with patient: 1 hour Principal Problem: PTSD (post-traumatic stress disorder) Diagnosis:   Patient Active Problem List   Diagnosis Date Noted  . Alcohol use disorder, moderate, dependence ([facility]) [F10.20] 07/30/2015  . 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 PTSD (post-traumatic stress disorder) [F43.10] 07/29/2015  . Depression [F32.9] 07/29/2015   Subjective Data: see H&P  Continued Clinical Symptoms:  Alcohol Use Disorder Identification Test Final Score (AUDIT): 0 The "Alcohol Use Disorders Identification Test", Guidelines for Use in Primary Care, Second Edition.  World Science writerHealth Organization [facility](WHO). Score between 0-7:  no or low risk or alcohol related problems. Score between 8-15:  moderate risk of alcohol related problems. Score between 16-19:  high risk of alcohol related problems. Score 20 or above:  warrants further diagnostic evaluation for alcohol dependence and treatment.   CLINICAL FACTORS:   Severe Anxiety and/or Agitation Depression:   Severe More than one psychiatric diagnosis Currently Psychotic Previous Psychiatric Diagnoses and Treatments  Psychiatric Specialty Exam: Physical Exam  Nursing note and vitals reviewed.   ROS- See H&P  Blood pressure 97/75, pulse (!) 107, temperature 98 F (36.7 C), temperature source Oral, resp. rate 18, height 5\' 3"  (1.6 m), weight 85.7 kg, last menstrual period 12/19/2017, SpO2 100 %.Body mass index is 33.48 kg/m.    COGNITIVE FEATURES THAT CONTRIBUTE TO RISK:  None    SUICIDE RISK:   Moderate:  Frequent suicidal ideation with limited intensity, and duration, some specificity in terms of plans, no  associated intent, good self-control, limited dysphoria/symptomatology, some risk factors present, and identifiable protective factors, including available and accessible social support.  PLAN OF CARE: See H&P for details  I certify that inpatient services furnished can reasonably be expected to improve the patient's condition.   Micheal Likenshristopher T Lajoy Vanamburg, MD 01/12/2018, 4:46 PM

## 2018-01-12 NOTE — Progress Notes (Signed)
Patient presents with anxious/manic/agitated/delusional/paranoid affect and behavior during admission interview and assessment. VS monitored and recorded. Skin check performed with Taffney MHT  and revealed bruises on right elbow to right forearm. Contraband was not found. Patient was oriented to unit and schedule. Pt states "I need my bags. Where is andrew? I have to find him. He's in one of these lockers I know he is here". Pt denies SI/HI/AVH at this time but is interacting with internal stimuli. PO fluids provided. Pt became increasingly agitated, refusing to sign paperwork and comply with meds and rules of unit. Pt was escorted to bedroom and IM meds were given. Safety maintained. Rest encouraged.

## 2018-01-12 NOTE — [facility] Suicide Risk Assessment (Signed)
[facility] INPATIENT:  Family/Significant Other Suicide Prevention Education  Suicide Prevention Education:  Education Completed; No one has been identified by the patient as the family member/significant other with whom the patient will be residing, and identified as the person(s) who will aid the patient in the event of a mental health crisis (suicidal ideations/suicide attempt).  With written consent from the patient, the family member/significant other has been provided the following suicide prevention education, prior to the and/or following the discharge of the patient.  The suicide prevention education provided includes the following:  Suicide risk factors  Suicide prevention and interventions  National Suicide Hotline telephone number  [facility] assessment telephone number  [facility] Emergency Assistance 911  [facility] and/or Residential Mobile Crisis Unit telephone number  Request made of family/significant other to:  Remove weapons (e.g., guns, rifles, knives), all items previously/currently identified as safety concern.    Remove drugs/medications (over-the-counter, prescriptions, illicit drugs), all items previously/currently identified as a safety concern.  The family member/significant other verbalizes understanding of the suicide prevention education information provided.  The family member/significant other agrees to remove the items of safety concern listed above.  The patient did not endorse SI at the time of admission, nor did the patient c/o SI during the stay here.  SPE not required.   Tara RogueRodney B Tara Yang 01/12/2018, 8:15 AM

## 2018-01-12 NOTE — Progress Notes (Signed)
Adult Psychoeducational Group Note  Date:  01/12/2018 Time:  8:31 PM  Group Topic/Focus:  Wrap-Up Group:   The focus of this group is to help patients review their daily goal of treatment and discuss progress on daily workbooks.  Participation Level:  Minimal  Participation Quality:  Appropriate  Affect:  Flat  Cognitive:  Alert  Insight: Appropriate  Engagement in Group:  Lacking  Modes of Intervention:  Discussion  Additional Comments:  Pt stated that today was an "interesting day". She did not want to elaborate on what she meant by that. Pt did not have a goal to work towards.  Kaleen OdeaCOOKE, Olan Kurek R 01/12/2018, 8:31 PM

## 2018-01-13 LAB — HEMOGLOBIN A1C
Hgb A1c MFr Bld: 4.9 % (ref 4.8–5.6)
Mean Plasma Glucose: 93.93 mg/dL

## 2018-01-13 NOTE — Progress Notes (Signed)
   01/13/18 0500  Sleep  Number of Hours 6.75   Goal met.

## 2018-01-13 NOTE — Plan of Care (Signed)
Problem: Safety: Goal: Periods of time without injury will increase Outcome: Progressing D: Pt visible in milieu  / nursing station at brief intervals during shift. Remains isolative to room majority of this shift, guarded with depressed affect affect. Interactions is on as need basis. Denies SI, HI, AVH and pain when assessed. A & O X3. Rates her anxiety, depression and hopelessness all 5/10 on self inventory sheet. Reports she's sleeping well with fair appetite, low energy and poor concentration level. Pt states her goal for today is "writing and journaling my thoughts". A: Emotional support offered. Encouraged pt to voice concerns and comply with current treatment regimen. All medications given as ordered with verbal education and effects monitored. Safety checks maintained without self harm gestures or outburst to note thus far.  R: Pt remains medication compliant denies adverse drug reactions when assessed. Pt did not attend groups as scheduled despite multiple prompts.  Tolerates all PO intake well. POC remains effective for safety and mood stability.

## 2018-01-13 NOTE — Progress Notes (Signed)
[facility] MD Progress Note  01/13/2018 11:32 AM Kc Sedlak  MRN:  [id number] Subjective:    Tara Yang is a 32 y/o F with history of PTSD, depression, and alcohol use disorder who was admitted from [facility] on IVC placed in the ED after she presented brought in by her sister with increasingly bizarre behaviors, latency of speech, anxiety, worsening depression, and apparent response to internal stimuli. She also has not been getting out of bed and not caring for her ADL's. While in the ED, pt was paranoid and bizarre, speaking about concerns of her family's safety and becoming infected with HIV. She became agitated in the ED, pacing, and intrusive with others. She required IM medications to manage her agitation in the ED. She was placed on IVC due to her disorganized behaviors and mood symptoms. She was medically cleared and then transferred to [facility] for additional treatment and stabilization. She was started on trial of zoloft and zyprexa, and she was monitored on the inpatient psychiatry unit.  Today upon evaluation, pt shares, "I'm tired and sleepy." She reports that she slept well overnight, as compared to prior to admission when she was struggling with initial insomnia. She reports her mood as "alright," and she describes still struggling with some ongoing depression. Pt denies SI/HI/AH/VH. She denies other physical complaints, and she is tolerating her medications well. Pt asked about role of zoloft in her medications and we discussed how it is addressing depression, anxiety, and symptoms related to exposure to previous traumatic events. Discussed with patient that AM fatigue may be associated with adjusting to her new medications, and we will continue to monitor for tolerability and efficacy of her medications during her stay. Pt is in agreement to continue her current treatment regimen without changes.   Principal Problem: PTSD (post-traumatic stress disorder) Diagnosis:   Patient Active Problem List    Diagnosis Date Noted  . Alcohol use disorder, moderate, dependence (HCC) [F10.20] 07/30/2015  . 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 PTSD (post-traumatic stress disorder) [F43.10] 07/29/2015  . Depression [F32.9] 07/29/2015   Total Time spent with patient: 30 minutes  Past Psychiatric History: see H&P for details  Past Medical History:  Past Medical History:  Diagnosis Date  . Alcohol abuse   . Anxiety   . Medical history non-contributory   . PTSD (post-traumatic stress disorder)    History reviewed. No pertinent surgical history. Family History:  Family History  Problem Relation Age of Onset  . Alcoholism Other    Family Psychiatric  History: see H&P for details Social History:  Social History   Substance and Sexual Activity  Alcohol Use Not Currently  . Alcohol/week: 0.0 standard drinks   Comment: unknown as pt not able to recall     Social History   Substance and Sexual Activity  Drug Use No   Comment: unknown as pt is not able to recall    Social History   Socioeconomic History  . Marital status: Single    Spouse name: Not on file  . Number of children: Not on file  . Years of education: Not on file  . Highest education level: Not on file  Occupational History  . Not on file  Social Needs  . Financial resource strain: Not on file  . Food insecurity:    Worry: Not on file    Inability: Not on file  . Transportation needs:    Medical: Not on file  Non-medical: Not on file  Tobacco Use  . Smoking status: Former Games developermoker  . Smokeless tobacco: Never Used  Substance and Sexual Activity  . Alcohol use: Not Currently    Alcohol/week: 0.0 standard drinks    Comment: unknown as pt not able to recall  . Drug use: No    Comment: unknown as pt is not able to recall  . Sexual activity: Yes    Birth control/protection: Injection  Lifestyle  . Physical activity:    Days per week: Not on file     Minutes per session: Not on file  . Stress: Not on file  Relationships  . Social connections:    Talks on phone: Not on file    Gets together: Not on file    Attends religious service: Not on file    Active member of club or organization: Not on file    Attends meetings of clubs or organizations: Not on file    Relationship status: Not on file  Other Topics Concern  . Not on file  Social History Narrative  . Not on file   Additional Social History:                         Sleep: Good  Appetite:  Good  Current Medications: Current Facility-Administered Medications  Medication Dose Route Frequency Provider Last Rate Last Dose  . acetaminophen (TYLENOL) tablet 650 mg  650 mg Oral Q6H PRN Truman HaywardStarkes, Takia S, FNP      . alum & mag hydroxide-simeth (MAALOX/MYLANTA) 200-200-20 MG/5ML suspension 30 mL  30 mL Oral Q4H PRN Truman HaywardStarkes, Takia S, FNP      . ziprasidone (GEODON) injection 20 mg  20 mg Intramuscular BID PRN Truman HaywardStarkes, Takia S, FNP   20 mg at 01/12/18 0016   And  . diphenhydrAMINE (BENADRYL) injection 50 mg  50 mg Intramuscular BID PRN Truman HaywardStarkes, Takia S, FNP   50 mg at 01/12/18 0016  . hydrOXYzine (ATARAX/VISTARIL) tablet 50 mg  50 mg Oral Q6H PRN Micheal Likensainville, Serapio Edelson T, MD      . OLANZapine zydis (ZYPREXA) disintegrating tablet 5 mg  5 mg Oral Q8H PRN Truman HaywardStarkes, Takia S, FNP       And  . LORazepam (ATIVAN) tablet 1 mg  1 mg Oral Q6H PRN Starkes, Takia S, FNP      . magnesium hydroxide (MILK OF MAGNESIA) suspension 30 mL  30 mL Oral Daily PRN Truman HaywardStarkes, Takia S, FNP      . Melatonin TABS 5 mg  5 mg Oral QHS Truman HaywardStarkes, Takia S, FNP   5 mg at 01/12/18 2159  . OLANZapine (ZYPREXA) tablet 5 mg  5 mg Oral QHS Micheal Likensainville, Andreea Arca T, MD   5 mg at 01/12/18 2159  . potassium chloride (K-DUR,KLOR-CON) CR tablet 10 mEq  10 mEq Oral Daily Truman HaywardStarkes, Takia S, FNP   10 mEq at 01/13/18 [id number]  . sertraline (ZOLOFT) tablet 50 mg  50 mg Oral Daily Micheal Likensainville, Shabria Egley T, MD   50 mg at 01/13/18  [id number]  . traZODone (DESYREL) tablet 50 mg  50 mg Oral QHS PRN,MR X 1 Louvina Cleary, Burlene Arnthristopher T, MD   50 mg at 01/12/18 2220    Lab Results:  Results for orders placed or performed during the hospital encounter of 01/11/18 (from the past 48 hour(s))  Hemoglobin A1c     Status: None   Collection Time: 01/12/18  6:25 PM  Result Value Ref Range   Hgb A1c MFr  Bld 4.9 4.8 - 5.6 %    Comment: (NOTE) Pre diabetes:          5.7%-6.4% Diabetes:              >6.4% Glycemic control for   <7.0% adults with diabetes    Mean Plasma Glucose 93.93 mg/dL    Comment: Performed at [facility], [address] [place], [place] [address]  Lipid panel     Status: None   Collection Time: 01/12/18  6:25 PM  Result Value Ref Range   Cholesterol 146 0 - 200 mg/dL   Triglycerides 59 <604 mg/dL   HDL 49 >54 mg/dL   Total CHOL/HDL Ratio 3.0 RATIO   VLDL 12 0 - 40 mg/dL   LDL Cholesterol 85 0 - 99 mg/dL    Comment:        Total Cholesterol/HDL:CHD Risk Coronary Heart Disease Risk Table                     Men   Women  1/2 Average Risk   3.4   3.3  Average Risk       5.0   4.4  2 X Average Risk   9.6   7.1  3 X Average Risk  23.4   11.0        Use the calculated Patient Ratio above and the CHD Risk Table to determine the patient's CHD Risk.        ATP III CLASSIFICATION (LDL):  <100     mg/dL   Optimal  098-119  mg/dL   Near or Above                    Optimal  130-159  mg/dL   Borderline  147-829  mg/dL   High  >562     mg/dL   Very High Performed at [facility], [address], [place], [place] [address]   TSH     Status: None   Collection Time: 01/12/18  6:25 PM  Result Value Ref Range   TSH 1.235 0.350 - 4.500 uIU/mL    Comment: Performed by a 3rd Generation assay with a functional sensitivity of <=0.01 uIU/mL. Performed at [facility], [address] [place], [place] [address]     Blood Alcohol level:  Lab Results  Component Value Date    ETH <10 01/11/2018   ETH <5 07/26/2015    Metabolic Disorder Labs: Lab Results  Component Value Date   HGBA1C 4.9 01/12/2018   MPG 93.93 01/12/2018   No results found for: PROLACTIN Lab Results  Component Value Date   CHOL 146 01/12/2018   TRIG 59 01/12/2018   HDL 49 01/12/2018   CHOLHDL 3.0 01/12/2018   VLDL 12 01/12/2018   LDLCALC 85 01/12/2018   LDLCALC 63 07/21/2016    Physical Findings: AIMS:  , ,  ,  ,    CIWA:    COWS:     Musculoskeletal: Strength & Muscle Tone: within normal limits Gait & Station: normal Patient leans: N/A  Psychiatric Specialty Exam: Physical Exam  Nursing note and vitals reviewed.   Review of Systems  Constitutional: Positive for malaise/fatigue. Negative for chills and fever.  Respiratory: Negative for cough and shortness of breath.   Cardiovascular: Negative for chest pain.  Gastrointestinal: Negative for abdominal pain, heartburn, nausea and vomiting.  Psychiatric/Behavioral: Positive for depression. Negative for hallucinations and suicidal ideas. The patient is nervous/anxious.  The patient does not have insomnia.     Blood pressure 99/87, pulse 80, temperature 98.3 F (36.8 C), temperature source Oral, resp. rate 16, height 5\' 3"  (1.6 m), weight 85.7 kg, last menstrual period 12/19/2017, SpO2 100 %.Body mass index is 33.48 kg/m.  General Appearance: Casual and Fairly Groomed  Eye Contact:  Good  Speech:  Clear and Coherent and Normal Rate  Volume:  Normal  Mood:  Anxious and Depressed  Affect:  Appropriate, Congruent and Constricted  Thought Process:  Coherent and Goal Directed  Orientation:  Full (Time, Place, and Person)  Thought Content:  Logical  Suicidal Thoughts:  No  Homicidal Thoughts:  No  Memory:  Immediate;   Fair Recent;   Fair Remote;   Fair  Judgement:  Poor  Insight:  Lacking  Psychomotor Activity:  Normal  Concentration:  Concentration: Fair  Recall:  FiservFair  Fund of Knowledge:  Fair  Language:  Fair   Akathisia:  No  Handed:    AIMS (if indicated):     Assets:  Resilience Social Support  ADL's:  Intact  Cognition:  WNL  Sleep:  Number of Hours: 6.75   Treatment Plan Summary: Daily contact with patient to assess and evaluate symptoms and progress in treatment and Medication management   -Continue inpatient hospitalization  -Bipolar I, current episode manic, with psychotic features and PTSD   -Continue zoloft 50mg  po qDay  -Continue zyprexa 5mg  po qhs  -Anxiety   -Continue vistaril 50mg  po q6h prn anxiety  -Insomnia    -Continue melatonin 5mg  po qhs   - Continue trazodone 50mg  po qhs prn insomnia (may repeat x1 PRN)  -hypokalemia    -Continue Klor-Con CR 10mEq po qDay for total of 3 doses  -agitation   - Continue zydis 5mg  po q8h prn agitation   -Continue ativan 1mg  po q6h prn agitation   -Continue geodon 20mg  IM q12h prn severe agitation   -Continue benadryl 50mg  IM q12h prn severe agitation  -Encourage participation in groups and therapeutic milieu  -Disposition planning will be ongoing  Micheal Likenshristopher T Kaiea Esselman, MD 01/13/2018, 11:32 AM

## 2018-01-13 NOTE — [facility] Group Notes (Signed)
LCSW Group Therapy Note  01/13/2018 1:15pm  Type of Therapy and Topic:  Group Therapy:  Feelings around Relapse and Recovery  Participation Level:  Did Not Attend   Description of Group:    Patients in this group will discuss emotions they experience before and after a relapse. They will process how experiencing these feelings, or avoidance of experiencing them, relates to having a relapse. Facilitator will guide patients to explore emotions they have related to recovery. Patients will be encouraged to process which emotions are more powerful. They will be guided to discuss the emotional reaction significant others in their lives may have to their relapse or recovery. Patients will be assisted in exploring ways to respond to the emotions of others without this contributing to a relapse.  Therapeutic Goals: 1. Patient will identify two or more emotions that lead to a relapse for them 2. Patient will identify two emotions that result when they relapse 3. Patient will identify two emotions related to recovery 4. Patient will demonstrate ability to communicate their needs through discussion and/or role plays   Summary of Patient Progress:     Therapeutic Modalities:   Cognitive Behavioral Therapy Solution-Focused Therapy Assertiveness Training Relapse Prevention Therapy   Tara RogueRodney B Eeva Schlosser, LCSW 01/13/2018 4:30 PM
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
## 2018-01-13 NOTE — Progress Notes (Signed)
Nursing Progress Note: 7p-7a D: Pt currently presents with a depressed/anxious affect and behavior. Pt states "I have had better days. I should be taking trentillex." Interacting appropriately with the milieu. Pt reports good sleep during the previous night with current medication regimen.  A: Pt provided with medications per providers orders. Pt's labs and vitals were monitored throughout the night. Pt supported emotionally and encouraged to express concerns and questions. Pt educated on medications.  R: Pt's safety ensured with 15 minute and environmental checks. Pt currently denies SI, HI, and AVH. Pt verbally contracts to seek staff if SI,HI, or AVH occurs and to consult with staff before acting on any harmful thoughts. Will continue to monitor.

## 2018-01-13 NOTE — Progress Notes (Signed)
Patient did not attend wrap up group. 

## 2018-01-14 NOTE — Progress Notes (Signed)
[facility]  01/14/2018 11:29 AM Tara Yang Raffety  MRN:  [id number] Subjective:    Tara Yang Burciaga is a [age] y/o F with history of PTSD, depression, and alcohol use disorder who was admitted from [facility] on IVC placed in the ED after she presented brought in by her sister with increasingly bizarre behaviors, latency of speech, anxiety, worsening depression, and apparent response to internal stimuli. She also has not been getting out of bed and not caring for her ADL's. While in the ED, pt was paranoid and bizarre, speaking about concerns of her family's safety and becoming infected with HIV. She became agitated in the ED, pacing, and intrusive with others. She required IM medications to manage her agitation in the ED. She was placed on IVC due to her disorganized behaviors and mood symptoms. She was medically cleared and [facility] She was started on trial of zoloft and zyprexa, and she was monitored on the inpatient psychiatry unit. She has been reporting incremental improvement of her presenting symptoms.  Today upon evaluation, pt shares, "It's going okay." She denies any specific concerns today. She is sleeping well. Her appetite is good. She denies other physical complaints. She denies SI/HI/AH/VH. She is tolerating her medications well, and she feels that they are helpul. Pt had a visit from her sister last night, and she reports it went well. Pt is in agreement to continue her current treatment regimen without changes. Pt's mother was called via telephone with pt present for collateral information, and we explored topics such as pt's loss of her grandfather as trigger and her abrupt change in her drinking pattern last month. Pt identifies times when she is on medication as doing better overall, and she reaffirms her commitment to stay on medications after discharge. Pt is in agreement to continue her current treatment regimen without changes.  She had no further questions, comments, or concerns.  Principal Problem: PTSD (post-traumatic stress disorder) Diagnosis:   Patient Active Problem List   Diagnosis Date Noted  . Alcohol use disorder, moderate, dependence (HCC) [F10.20] 07/30/2015  . 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 PTSD (post-traumatic stress disorder) [F43.10] 07/29/2015  . Depression [F32.9] 07/29/2015   Total Time spent with patient: 30 minutes  Past Psychiatric History: See H&P  Past Medical History:  Past Medical History:  Diagnosis Date  . Alcohol abuse   . Anxiety   . Medical history non-contributory   . PTSD (post-traumatic stress disorder)    History reviewed. No pertinent surgical history. Family History:  Family History  Problem Relation Age of Onset  . Alcoholism Other    Family Psychiatric  History: See H&P Social History:  Social History   Substance and Sexual Activity  Alcohol Use Not Currently  . Alcohol/week: 0.0 standard drinks   Comment: unknown as pt not able to recall     Social History   Substance and Sexual Activity  Drug Use No   Comment: unknown as pt is not able to recall    Social History   Socioeconomic History  . Marital status: Single    Spouse name: Not on file  . Number of children: Not on file  . Years of education: Not on file  . Highest education level: Not on file  Occupational History  . Not on file  Social Needs  . Financial resource strain: Not on file  . Food insecurity:    Worry:  Not on file    Inability: Not on file  . Transportation needs:    Medical: Not on file    Non-medical: Not on file  Tobacco Use  . Smoking status: Former Games developermoker  . Smokeless tobacco: Never Used  Substance and Sexual Activity  . Alcohol use: Not Currently    Alcohol/week: 0.0 standard drinks    Comment: unknown as pt not able to recall  . Drug use: No    Comment: unknown as pt is not able to recall  .  Sexual activity: Yes    Birth control/protection: Injection  Lifestyle  . Physical activity:    Days per week: Not on file    Minutes per session: Not on file  . Stress: Not on file  Relationships  . Social connections:    Talks on phone: Not on file    Gets together: Not on file    Attends religious service: Not on file    Active member of club or organization: Not on file    Attends meetings of clubs or organizations: Not on file    Relationship status: Not on file  Other Topics Concern  . Not on file  Social History Narrative  . Not on file   Additional Social History:                         Sleep: Good  Appetite:  Good  Current Medications: Current Facility-Administered Medications  Medication Dose Route Frequency Provider Last Rate Last Dose  . acetaminophen (TYLENOL) tablet 650 mg  650 mg Oral Q6H PRN Truman HaywardStarkes, Takia S, FNP      . alum & mag hydroxide-simeth (MAALOX/MYLANTA) 200-200-20 MG/5ML suspension 30 mL  30 mL Oral Q4H PRN Truman HaywardStarkes, Takia S, FNP      . ziprasidone (GEODON) injection 20 mg  20 mg Intramuscular BID PRN Truman HaywardStarkes, Takia S, FNP   20 mg at 01/12/18 0016   And  . diphenhydrAMINE (BENADRYL) injection 50 mg  50 mg Intramuscular BID PRN Truman HaywardStarkes, Takia S, FNP   50 mg at 01/12/18 0016  . hydrOXYzine (ATARAX/VISTARIL) tablet 50 mg  50 mg Oral Q6H PRN Micheal Likensainville, Allin Frix T, MD      . OLANZapine zydis (ZYPREXA) disintegrating tablet 5 mg  5 mg Oral Q8H PRN Truman HaywardStarkes, Takia S, FNP       And  . LORazepam (ATIVAN) tablet 1 mg  1 mg Oral Q6H PRN Starkes, Takia S, FNP      . magnesium hydroxide (MILK OF MAGNESIA) suspension 30 mL  30 mL Oral Daily PRN Truman HaywardStarkes, Takia S, FNP      . Melatonin TABS 5 mg  5 mg Oral QHS Truman HaywardStarkes, Takia S, FNP   5 mg at 01/13/18 2139  . OLANZapine (ZYPREXA) tablet 5 mg  5 mg Oral QHS Micheal Likensainville, Theresa Wedel T, MD   5 mg at 01/13/18 2139  . sertraline (ZOLOFT) tablet 50 mg  50 mg Oral Daily Micheal Likensainville, Kehaulani Fruin T, MD   50 mg at  01/14/18 0820  . traZODone (DESYREL) tablet 50 mg  50 mg Oral QHS PRN,MR X 1 Ceniyah Thorp, Burlene Arnthristopher T, MD   50 mg at 01/13/18 2139    Lab Results:  Results for orders placed or performed during the hospital encounter of 01/11/18 (from the past 48 hour(s))  Hemoglobin A1c     Status: None   Collection Time: 01/12/18  6:25 PM  Result Value Ref Range   Hgb A1c MFr Bld  4.9 4.8 - 5.6 %    Comment: (NOTE) Pre diabetes:          5.7%-6.4% Diabetes:              >6.4% Glycemic control for   <7.0% adults with diabetes    Mean Plasma Glucose 93.93 mg/dL    Comment: Performed at [facility], [address], [place], [place] [address]  Lipid panel     Status: None   Collection Time: 01/12/18  6:25 PM  Result Value Ref Range   Cholesterol 146 0 - 200 mg/dL   Triglycerides 59 <191 mg/dL   HDL 49 >47 mg/dL   Total CHOL/HDL Ratio 3.0 RATIO   VLDL 12 0 - 40 mg/dL   LDL Cholesterol 85 0 - 99 mg/dL    Comment:        Total Cholesterol/HDL:CHD Risk Coronary Heart Disease Risk Table                     Men   Women  1/2 Average Risk   3.4   3.3  Average Risk       5.0   4.4  2 X Average Risk   9.6   7.1  3 X Average Risk  23.4   11.0        Use the calculated Patient Ratio above and the CHD Risk Table to determine the patient's CHD Risk.        ATP III CLASSIFICATION (LDL):  <100     mg/dL   Optimal  829-562  mg/dL   Near or Above                    Optimal  130-159  mg/dL   Borderline  130-865  mg/dL   High  >784     mg/dL   Very High Performed at [facility], [address], [place], [place] [address]   TSH     Status: None   Collection Time: 01/12/18  6:25 PM  Result Value Ref Range   TSH 1.235 0.350 - 4.500 uIU/mL    Comment: Performed by a 3rd Generation assay with a functional sensitivity of <=0.01 uIU/mL. Performed at [facility], [address], [place], [place] [address]     Blood Alcohol level:  Lab Results  Component  Value Date   ETH <10 01/11/2018   ETH <5 07/26/2015    Metabolic Disorder Labs: Lab Results  Component Value Date   HGBA1C 4.9 01/12/2018   MPG 93.93 01/12/2018   No results found for: PROLACTIN Lab Results  Component Value Date   CHOL 146 01/12/2018   TRIG 59 01/12/2018   HDL 49 01/12/2018   CHOLHDL 3.0 01/12/2018   VLDL 12 01/12/2018   LDLCALC 85 01/12/2018   LDLCALC 63 07/21/2016    Physical Findings: AIMS:  , ,  ,  ,    CIWA:    COWS:     Musculoskeletal: Strength & Muscle Tone: within normal limits Gait & Station: normal Patient leans: N/A  Psychiatric Specialty Exam: Physical Exam  Nursing note and vitals reviewed.   Review of Systems  Constitutional: Negative for chills and fever.  Respiratory: Negative for cough and shortness of breath.   Cardiovascular: Negative for chest pain.  Gastrointestinal: Negative for abdominal pain, heartburn, nausea and vomiting.  Psychiatric/Behavioral: Negative for depression, hallucinations and suicidal ideas. The patient is not nervous/anxious and does not have insomnia.  Blood pressure 117/64, pulse (!) 122, temperature 98.6 F (37 C), temperature source Oral, resp. rate 18, height 5\' 3"  (1.6 m), weight 85.7 kg, last menstrual period 12/19/2017, SpO2 100 %.Body mass index is 33.48 kg/m.  General Appearance: Casual and Fairly Groomed  Eye Contact:  Good  Speech:  Clear and Coherent and Normal Rate  Volume:  Normal  Mood:  Anxious and Depressed  Affect:  Appropriate, Congruent and Constricted  Thought Process:  Coherent and Goal Directed  Orientation:  Full (Time, Place, and Person)  Thought Content:  Logical  Suicidal Thoughts:  No  Homicidal Thoughts:  No  Memory:  Immediate;   Fair Recent;   Fair Remote;   Fair  Judgement:  Fair  Insight:  Lacking  Psychomotor Activity:  Normal  Concentration:  Concentration: Fair  Recall:  [employer] of Knowledge:  Fair  Language:  Fair  Akathisia:  No  Handed:    AIMS  (if indicated):     Assets:  Resilience Social Support  ADL's:  Intact  Cognition:  WNL  Sleep:  Number of Hours: 6.5   Treatment Plan Summary: Daily contact with patient to assess and evaluate symptoms and progress in treatment and Medication management   -Continue inpatient hospitalization  -Bipolar I, current episode manic, with psychotic features and PTSD             -Continue zoloft 50mg  po qDay             -Continue zyprexa 5mg  po qhs  -Anxiety             -Continue vistaril 50mg  po q6h prn anxiety  -Insomnia              -Continue melatonin 5mg  po qhs             - Continue trazodone 50mg  po qhs prn insomnia (may repeat x1 PRN)  -hypokalemia             -Continue Klor-Con CR po qDay for total of 3 doses  -agitation             - Continue zydis 5mg  po q8h prn agitation             -Continue ativan 1mg  po q6h prn agitation             -Continue geodon 20mg  IM q12h prn severe agitation             -Continue benadryl 50mg  IM q12h prn severe agitation  -Encourage participation in groups and therapeutic milieu  -Disposition planning will be ongoing  Micheal Likens, MD 01/14/2018, 11:29 AM

## 2018-01-14 NOTE — [facility] Group Notes (Signed)
[facility] LCSW Group Therapy  01/14/2018  1:05 PM  Type of Therapy:  Group therapy  Participation Level:  Active  Participation Quality:  Attentive  Affect:  Flat  Cognitive:  Oriented  Insight:  Limited  Engagement in Therapy:  Limited  Modes of Intervention:  Discussion, Socialization  Summary of Progress/Problems:  Chaplain was here to lead a group on themes of hope and courage. "Hope is not having to live in fear."  Stated this is based on past trauma that she has experienced. Minimal contributions, but engaged throughout.  Daryel Geraldorth, Tara Yang B 01/14/2018 1:21 PM

## 2018-01-14 NOTE — Progress Notes (Signed)
DAR NOTE: Pt present with flat affect and depressed mood in the unit. Pt has been isolating herself and has been mostly in the room. Pt stated her goal for today is to try and participate in groups and find  A place to go to when discharged. Pt denies physical pain, took all her  meds as scheduled. As per self inventory, pt had a good night sleep, fair appetite, low energy, and poor concentration. Pt rate depression at 5, hopeless ness at 0, and anxiety at 5. Pt's safety ensured with 15 minute and environmental checks. Pt currently denies SI/HI and A/V hallucinations. Pt verbally agrees to seek staff if SI/HI or A/VH occurs and to consult with staff before acting on these thoughts. Will continue POC.

## 2018-01-14 NOTE — Plan of Care (Signed)
  Problem: Education: Goal: Knowledge of [facility] Education information/materials will improve Outcome: Progressing Goal: Emotional status will improve Outcome: Progressing Goal: Mental status will improve Outcome: Progressing Goal: Verbalization of understanding the information provided will improve Outcome: Progressing   Problem: Activity: Goal: Interest or engagement in activities will improve Outcome: Progressing Goal: Sleeping patterns will improve Outcome: Progressing   Problem: Coping: Goal: Ability to verbalize frustrations and anger appropriately will improve Outcome: Progressing Goal: Ability to demonstrate self-control will improve Outcome: Progressing   Problem: Health Behavior/Discharge Planning: Goal: Identification of resources available to assist in meeting health care needs will improve Outcome: Progressing Goal: Compliance with treatment plan for underlying cause of condition will improve Outcome: Progressing   Problem: Physical Regulation: Goal: Ability to maintain clinical measurements within normal limits will improve Outcome: Progressing   Problem: Safety: Goal: Periods of time without injury will increase Outcome: Progressing   Problem: Education: Goal: Utilization of techniques to improve thought processes will improve Outcome: Progressing Goal: Knowledge of the prescribed therapeutic regimen will improve Outcome: Progressing   Problem: Activity: Goal: Interest or engagement in leisure activities will improve Outcome: Progressing Goal: Imbalance in normal sleep/wake cycle will improve Outcome: Progressing   Problem: Coping: Goal: Coping ability will improve Outcome: Progressing Goal: Will verbalize feelings Outcome: Progressing   Problem: Health Behavior/Discharge Planning: Goal: Ability to make decisions will improve Outcome: Progressing Goal: Compliance with therapeutic regimen will improve Outcome: Progressing    Problem: Role Relationship: Goal: Will demonstrate positive changes in social behaviors and relationships Outcome: Progressing   Problem: Safety: Goal: Ability to disclose and discuss suicidal ideas will improve Outcome: Progressing Goal: Ability to identify and utilize support systems that promote safety will improve Outcome: Progressing   Problem: Self-Concept: Goal: Will verbalize positive feelings about self Outcome: Progressing Goal: Level of anxiety will decrease Outcome: Progressing  Pt continues to progress towards goals and d/c. RN will continue to monitor.

## 2018-01-15 DIAGNOSIS — F419 Anxiety disorder, unspecified: Secondary | ICD-10-CM

## 2018-01-15 DIAGNOSIS — F4310 Post-traumatic stress disorder, unspecified: Secondary | ICD-10-CM

## 2018-01-15 DIAGNOSIS — F312 Bipolar disorder, current episode manic severe with psychotic features: Principal | ICD-10-CM

## 2018-01-15 DIAGNOSIS — G4700 Insomnia, unspecified: Secondary | ICD-10-CM

## 2018-01-15 NOTE — Progress Notes (Signed)
D. Pt presents with a flat affect and depressed, somewhat guarded behavior- isolating to room for much of the morning. Pt reports having slept well last night and endorses having 'good' concentration and a 'normal' energy level on her self inventory sheet. Per pt's self inventory, pt rates her depression, hopelessness and anxiety a 1/1/1, respectively. Pt writes that her most important goal to work on today is "relaxing" and she writes that "writing and clearing my thoughts" will help her meet her goal.   Pt currently denies SI/HI and AVH and verbally contracts for safety. A. Labs and vitals monitored. Pt compliant with medications. Pt given personal pitcher of water and encouraged to drink fluids Pt supported emotionally and encouraged to express concerns and ask questions.   R. Pt remains safe with 15 minute checks. Will continue POC.

## 2018-01-15 NOTE — [facility] Group Notes (Signed)
[facility] Group Notes:  (Nursing)  Date:  01/15/2018  Time: 1:30 PM Type of Therapy:  Nurse Education  Participation Level:  Minimal  Participation Quality:  Appropriate  Affect:  Flat  Cognitive:  Appropriate  Insight:  Improving  Engagement in Group:  Improving  Modes of Intervention:  Education  Summary of Progress/Problems: Nurse-led group on Anger Management  Shela NevinValerie S Evia Goldsmith 01/15/2018, 2:18 PM

## 2018-01-15 NOTE — Progress Notes (Signed)
Writer spoke with patient 1:1 and she reports that she has had a good day. Her parents came to visit and she reports her goal is to work on her stress. She did report that she had stopped taking hr medication and writer encouraged her to remain on her medication. She has been isolative to her room and guarded. Scheduled medications given and safety maintained on unit with 15 min checks.

## 2018-01-15 NOTE — [facility] Group Notes (Signed)
LCSW Group Therapy Note  01/15/2018    Type of Therapy and Topic:  Group Therapy: Anger and Coping Skills  Participation Level:  Active   Description of Group:   In this group, patients learned how to recognize the physical, cognitive, emotional, and behavioral responses they have to anger-provoking situations.  They identified how they usually or often react when angered, and learned how healthy and unhealthy coping skills work initially, but the unhealthy ones stop working.   They analyzed how their frequently-chosen coping skill is possibly beneficial and how it is possibly unhelpful.  The group discussed a variety of healthier coping skills that could help in resolving the actual issues, as well as how to go about planning for the the possibility of future similar situations.  Therapeutic Goals: 1. Patients will identify one thing that makes them angry and how they feel emotionally and physically, what their thoughts are or tend to be in those situations, and what healthy or unhealthy coping mechanism they typically use 2. Patients will identify how their coping technique works for them, as well as how it works against them. 3. Patients will explore possible new behaviors to use in future anger situations. 4. Patients will learn that anger itself is normal and cannot be eliminated, and that healthier coping skills can assist with resolving conflict rather than worsening situations.  Summary of Patient Progress:  The patient shared that she was "angry" recently when her grandfather died, and she ended up drinking a great deal.  We talked about the interactions of grief and anger, and she agreed that grief counseling may be a healthier way of dealing with her loss..  Therapeutic Modalities:   Cognitive Behavioral Therapy Motivation Interviewing  Lynnell ChadMareida J Grossman-Orr  .

## 2018-01-15 NOTE — Progress Notes (Signed)
Nursing note 7p-7a  Pt observed interacting with peers on unit this shift. Displayed a flat affect and anxious and depressed mood upon interaction with this Clinical research associatewriter. Pt denies pain ,denies SI/HI, and also denies any audio or visual hallucinations at this time.   Pt is able to verbally contract for safety with this RN. Pt is now resting in bed with eyes closed, with no signs or symptoms of pain or distress noted. Pt continues to remain safe on the unit and is observed by rounding every 15 min. RN will continue to monitor.

## 2018-01-15 NOTE — Progress Notes (Signed)
[facility] MD Progress Note  01/15/2018 10:03 AM Tara Yang  MRN:  [id number] Subjective:    Tara Yang is a 32 y/o F with history of PTSD, depression, and alcohol use disorder who was admitted from [facility] on IVC placed in the ED after she presented brought in by her sister with increasingly bizarre behaviors, latency of speech, anxiety, worsening depression, and apparent response to internal stimuli. She also has not been getting out of bed and not caring for her ADL's. While in the ED, pt was paranoid and bizarre, speaking about concerns of her family's safety and becoming infected with HIV. She became agitated in the ED, pacing, and intrusive with others. She required IM medications to manage her agitation in the ED. She was placed on IVC due to her disorganized behaviors and mood symptoms. She was medically cleared and then transferred to [facility] for additional treatment and stabilization. She was started on trial of zoloft and zyprexa, and she was monitored on the inpatient psychiatry unit. She has been reporting incremental improvement of her presenting symptoms.  On evaluation today patient is pleasant cooperative, but has flat affect and is lying in her bed.  She denies any SI/HI/AVH and contracts for safety.  Patient denies any hallucinations at all today.  She also states she cannot remember the last time that she had a hallucination.  She denies any medication side effects.  She reports sleeping and eating well and that she has no complaints.  She states that her parents visited last night and they feel that she is improving and she is hoping to continue her discharge plan to go home at the beginning of the week.  Principal Problem: PTSD (post-traumatic stress disorder) Diagnosis:   Patient Active Problem List   Diagnosis Date Noted  . Alcohol use disorder, moderate, dependence (HCC) [F10.20] 07/30/2015  . 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 PTSD (post-traumatic stress disorder) [F43.10] 07/29/2015  . Depression [F32.9] 07/29/2015   Total Time spent with patient: 20 minutes  Past Psychiatric History: See H&P  Past Medical History:  Past Medical History:  Diagnosis Date  . Alcohol abuse   . Anxiety   . Medical history non-contributory   . PTSD (post-traumatic stress disorder)    History reviewed. No pertinent surgical history. Family History:  Family History  Problem Relation Age of Onset  . Alcoholism Other    Family Psychiatric  History: See H&P Social History:  Social History   Substance and Sexual Activity  Alcohol Use Not Currently  . Alcohol/week: 0.0 standard drinks   Comment: unknown as pt not able to recall     Social History   Substance and Sexual Activity  Drug Use No   Comment: unknown as pt is not able to recall    Social History   Socioeconomic History  . Marital status: Single    Spouse name: Not on file  . Number of children: Not on file  . Years of education: Not on file  . Highest education level: Not on file  Occupational History  . Not on file  Social Needs  . Financial resource strain: Not on file  . Food insecurity:    Worry: Not on file    Inability: Not on file  . Transportation needs:    Medical: Not on file    Non-medical: Not on file  Tobacco Use  . Smoking status: Former Games developer  . Smokeless tobacco: Never Used  Substance  and Sexual Activity  . Alcohol use: Not Currently    Alcohol/week: 0.0 standard drinks    Comment: unknown as pt not able to recall  . Drug use: No    Comment: unknown as pt is not able to recall  . Sexual activity: Yes    Birth control/protection: Injection  Lifestyle  . Physical activity:    Days per week: Not on file    Minutes per session: Not on file  . Stress: Not on file  Relationships  . Social connections:    Talks on phone: Not on file    Gets together: Not on file    Attends religious service: Not on file     Active member of club or organization: Not on file    Attends meetings of clubs or organizations: Not on file    Relationship status: Not on file  Other Topics Concern  . Not on file  Social History Narrative  . Not on file   Additional Social History:                         Sleep: Good  Appetite:  Good  Current Medications: Current Facility-Administered Medications  Medication Dose Route Frequency Provider Last Rate Last Dose  . acetaminophen (TYLENOL) tablet 650 mg  650 mg Oral Q6H PRN Truman HaywardStarkes, Takia S, FNP      . alum & mag hydroxide-simeth (MAALOX/MYLANTA) 200-200-20 MG/5ML suspension 30 mL  30 mL Oral Q4H PRN Truman HaywardStarkes, Takia S, FNP      . ziprasidone (GEODON) injection 20 mg  20 mg Intramuscular BID PRN Truman HaywardStarkes, Takia S, FNP   20 mg at 01/12/18 0016   And  . diphenhydrAMINE (BENADRYL) injection 50 mg  50 mg Intramuscular BID PRN Truman HaywardStarkes, Takia S, FNP   50 mg at 01/12/18 0016  . hydrOXYzine (ATARAX/VISTARIL) tablet 50 mg  50 mg Oral Q6H PRN Micheal Likensainville, Christopher T, MD      . OLANZapine zydis (ZYPREXA) disintegrating tablet 5 mg  5 mg Oral Q8H PRN Truman HaywardStarkes, Takia S, FNP       And  . LORazepam (ATIVAN) tablet 1 mg  1 mg Oral Q6H PRN Starkes, Takia S, FNP      . magnesium hydroxide (MILK OF MAGNESIA) suspension 30 mL  30 mL Oral Daily PRN Truman HaywardStarkes, Takia S, FNP      . Melatonin TABS 5 mg  5 mg Oral QHS Truman HaywardStarkes, Takia S, FNP   5 mg at 01/14/18 2139  . OLANZapine (ZYPREXA) tablet 5 mg  5 mg Oral QHS Micheal Likensainville, Christopher T, MD   5 mg at 01/14/18 2139  . sertraline (ZOLOFT) tablet 50 mg  50 mg Oral Daily Micheal Likensainville, Christopher T, MD   50 mg at 01/15/18 0753  . traZODone (DESYREL) tablet 50 mg  50 mg Oral QHS PRN,MR X 1 Micheal Likensainville, Christopher T, MD   50 mg at 01/14/18 2140    Lab Results:  No results found for this or any previous visit (from the past 48 hour(s)).  Blood Alcohol level:  Lab Results  Component Value Date   ETH <10 01/11/2018   ETH <5 07/26/2015     Metabolic Disorder Labs: Lab Results  Component Value Date   HGBA1C 4.9 01/12/2018   MPG 93.93 01/12/2018   No results found for: PROLACTIN Lab Results  Component Value Date   CHOL 146 01/12/2018   TRIG 59 01/12/2018   HDL 49 01/12/2018   CHOLHDL 3.0 01/12/2018  VLDL 12 01/12/2018   LDLCALC 85 01/12/2018   LDLCALC 63 07/21/2016    Physical Findings: AIMS: Facial and Oral Movements Muscles of Facial Expression: None, normal Lips and Perioral Area: None, normal Jaw: None, normal Tongue: None, normal,Extremity Movements Upper (arms, wrists, hands, fingers): None, normal Lower (legs, knees, ankles, toes): None, normal, Trunk Movements Neck, shoulders, hips: None, normal, Overall Severity Severity of abnormal movements (highest score from questions above): None, normal Incapacitation due to abnormal movements: None, normal Patient's awareness of abnormal movements (rate only patient's report): No Awareness, Dental Status Current problems with teeth and/or dentures?: No Does patient usually wear dentures?: No  CIWA:    COWS:     Musculoskeletal: Strength & Muscle Tone: within normal limits Gait & Station: normal Patient leans: N/A  Psychiatric Specialty Exam: Physical Exam  Nursing note and vitals reviewed. Constitutional: She is oriented to person, place, and time. She appears well-developed and well-nourished.  Cardiovascular: Normal rate.  Respiratory: Effort normal.  Musculoskeletal: Normal range of motion.  Neurological: She is alert and oriented to person, place, and time.  Skin: Skin is warm.    Review of Systems  Constitutional: Negative.   HENT: Negative.   Eyes: Negative.   Respiratory: Negative.   Cardiovascular: Negative.   Gastrointestinal: Negative.   Genitourinary: Negative.   Musculoskeletal: Negative.   Skin: Negative.   Neurological: Negative.   Endo/Heme/Allergies: Negative.   Psychiatric/Behavioral: Negative.     Blood pressure (!)  86/53, pulse 90, temperature 98.3 F (36.8 C), temperature source Oral, resp. rate 18, height 5\' 3"  (1.6 m), weight 85.7 kg, last menstrual period 12/19/2017, SpO2 100 %.Body mass index is 33.48 kg/m.  General Appearance: Casual and Fairly Groomed  Eye Contact:  Good  Speech:  Clear and Coherent and Normal Rate  Volume:  Decreased  Mood:  Euthymic  Affect:  Flat  Thought Process:  Coherent and Goal Directed  Orientation:  Full (Time, Place, and Person)  Thought Content:  WDL  Suicidal Thoughts:  No  Homicidal Thoughts:  No  Memory:  Immediate;   Fair Recent;   Fair Remote;   Fair  Judgement:  Fair  Insight:  Lacking  Psychomotor Activity:  Normal  Concentration:  Concentration: Fair and Attention Span: Fair  Recall:  [employer]Fair  Fund of Knowledge:  Fair  Language:  Fair  Akathisia:  No  Handed:    AIMS (if indicated):     Assets:  Resilience Social Support  ADL's:  Intact  Cognition:  WNL  Sleep:  Number of Hours: 6.5   Problems addressed Bipolar 1 current episode manic with psychotic features PTSD Anxiety Insomnia  Treatment Plan Summary: Daily contact with patient to assess and evaluate symptoms and progress in treatment, Medication management and Plan is to: -Continue Vistaril 50 mg p.o. every 6 hours as needed for anxiety -Continue Zyprexa 5 mg p.o. nightly for mood stability -Continue Zoloft 50 mg p.o. daily for mood stability -Continue trazodone 50 mg p.o. nightly as needed for insomnia -Continue agitation protocol which includes Zyprexa and Ativan or Geodon and diphenhydramine -Encourage group therapy participation   Maryfrances Bunnellravis B Shelaine Frie, FNP 01/15/2018, 10:03 AM

## 2018-01-16 NOTE — Progress Notes (Signed)
D. Pt presents with a flat affect - is calm and cooperative, and brightens a little upon interactions. Pt remains quiet and isolative to her room for much of the morning, but states that she is "relaxing and journaling my thoughts to stay on a road to recovery from alcohol". Pt observed attending group led by SW this am. Per pt's self inventory, pt rates her depression, hopelessness and anxiety all 0's today. Pt currently denies SI/HI and AVH and agrees to contact staff before acting on any harmful thoughts.  A. Labs and vitals monitored. Pt compliant with medications. Pt supported emotionally and encouraged to express concerns and ask questions.   R. Pt remains safe with 15 minute checks. Will continue POC.

## 2018-01-16 NOTE — Progress Notes (Signed)
Writer spoke with patient 1:1 and discussed admission paperwork that had not been signed since being here. She signed admission papers and inquired about her being IVC 'd and what she needed to do to become voluntary. She was concerned about the upcoming court date and how it will affect her and what will be in her records. She has been isolative to her room and reported to Clinical research associatewriter that she does that so that she can focus on her and not become too involved in other peoples problems. She was complaint with her medications. Mother visited on tonight. Safety maintained on unit with 15 min checks.

## 2018-01-16 NOTE — Progress Notes (Signed)
Patient ID: Tara LandauDaphne Yang, female   DOB: 04/28/1986, 32 y.o.   MRN: [id number]  [facility]  01/16/2018 10:21 AM Tara LandauDaphne Yang  MRN:  [id number] Subjective:  "I'm just tired," despite sleep being "Ok".  Let her know if could be a side effect of her medication that will wane with time.  Denies depression and anxiety.  Forwards little.  Tara LandauDaphne Yang is a 32 y/o F with history of PTSD, depression, and alcohol use disorder who was admitted from [facility] on IVC placed in the ED after she presented brought in by her sister with increasingly bizarre behaviors, latency of speech, anxiety, worsening depression, and apparent response to internal stimuli. She also has not been getting out of bed and not caring for her ADL's. While in the ED, pt was paranoid and bizarre, speaking about concerns of her family's safety and becoming infected with HIV. She became agitated in the ED, pacing, and intrusive with others. She required IM medications to manage her agitation in the ED. She was placed on IVC due to her disorganized behaviors and mood symptoms. [facility] medically [facility] She was started on trial of zoloft and zyprexa, and she was monitored on the inpatient psychiatry unit. She has been reporting incremental improvement of her presenting symptoms.  On evaluation today patient is pleasant cooperative, but has flat affect and is walking around her room.  She is self isolating in her room and guarded.  She denies any SI/HI/AVH and contracts for safety.  Patient denies any hallucinations at all today.  She denies any medication side effects.  She reports sleeping and eating well and that she has no complaints.  She states that her parents visited last night and they feel that she is improving and she is hoping to continue her discharge plan to go home at the beginning of the week.  Principal Problem: PTSD (post-traumatic stress  disorder) Diagnosis:   Patient Active Problem List   Diagnosis Date Noted  . 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 PTSD (post-traumatic stress disorder) [F43.10] 07/29/2015    Priority: High  . Alcohol use disorder, moderate, dependence (HCC) [F10.20] 07/30/2015   Total Time spent with patient: 20 minutes  Past Psychiatric History: See H&P  Past Medical History:  Past Medical History:  Diagnosis Date  . Alcohol abuse   . Anxiety   . Medical history non-contributory   . PTSD (post-traumatic stress disorder)    History reviewed. No pertinent surgical history. Family History:  Family History  Problem Relation Age of Onset  . Alcoholism Other    Family Psychiatric  History: See H&P Social History:  Social History   Substance and Sexual Activity  Alcohol Use Not Currently  . Alcohol/week: 0.0 standard drinks   Comment: unknown as pt not able to recall     Social History   Substance and Sexual Activity  Drug Use No   Comment: unknown as pt is not able to recall    Social History   Socioeconomic History  . Marital status: Single    Spouse name: Not on file  . Number of children: Not on file  . Years of education: Not on file  . Highest education level: Not on file  Occupational History  . Not on file  Social Needs  . Financial resource strain: Not on file  . Food insecurity:  Worry: Not on file    Inability: Not on file  . Transportation needs:    Medical: Not on file    Non-medical: Not on file  Tobacco Use  . Smoking status: Former Games developermoker  . Smokeless tobacco: Never Used  Substance and Sexual Activity  . Alcohol use: Not Currently    Alcohol/week: 0.0 standard drinks    Comment: unknown as pt not able to recall  . Drug use: No    Comment: unknown as pt is not able to recall  . Sexual activity: Yes    Birth control/protection: Injection  Lifestyle  . Physical activity:     Days per week: Not on file    Minutes per session: Not on file  . Stress: Not on file  Relationships  . Social connections:    Talks on phone: Not on file    Gets together: Not on file    Attends religious service: Not on file    Active member of club or organization: Not on file    Attends meetings of clubs or organizations: Not on file    Relationship status: Not on file  Other Topics Concern  . Not on file  Social History Narrative  . Not on file   Additional Social History:                         Sleep: Good  Appetite:  Good  Current Medications: Current Facility-Administered Medications  Medication Dose Route Frequency Provider Last Rate Last Dose  . acetaminophen (TYLENOL) tablet 650 mg  650 mg Oral Q6H PRN Truman HaywardStarkes, Takia S, FNP      . alum & mag hydroxide-simeth (MAALOX/MYLANTA) 200-200-20 MG/5ML suspension 30 mL  30 mL Oral Q4H PRN Truman HaywardStarkes, Takia S, FNP      . ziprasidone (GEODON) injection 20 mg  20 mg Intramuscular BID PRN Truman HaywardStarkes, Takia S, FNP   20 mg at 01/12/18 0016   And  . diphenhydrAMINE (BENADRYL) injection 50 mg  50 mg Intramuscular BID PRN Truman HaywardStarkes, Takia S, FNP   50 mg at 01/12/18 0016  . hydrOXYzine (ATARAX/VISTARIL) tablet 50 mg  50 mg Oral Q6H PRN Micheal Likensainville, Christopher T, MD      . OLANZapine zydis (ZYPREXA) disintegrating tablet 5 mg  5 mg Oral Q8H PRN Truman HaywardStarkes, Takia S, FNP       And  . LORazepam (ATIVAN) tablet 1 mg  1 mg Oral Q6H PRN Starkes, Takia S, FNP      . magnesium hydroxide (MILK OF MAGNESIA) suspension 30 mL  30 mL Oral Daily PRN Truman HaywardStarkes, Takia S, FNP      . Melatonin TABS 5 mg  5 mg Oral QHS Truman HaywardStarkes, Takia S, FNP   5 mg at 01/15/18 2116  . OLANZapine (ZYPREXA) tablet 5 mg  5 mg Oral QHS Micheal Likensainville, Christopher T, MD   5 mg at 01/15/18 2116  . sertraline (ZOLOFT) tablet 50 mg  50 mg Oral Daily Micheal Likensainville, Christopher T, MD   50 mg at 01/16/18 0807  . traZODone (DESYREL) tablet 50 mg  50 mg Oral QHS PRN,MR X 1 Micheal Likensainville, Christopher  T, MD   50 mg at 01/14/18 2140    Lab Results:  No results found for this or any previous visit (from the past 48 hour(s)).  Blood Alcohol level:  Lab Results  Component Value Date   [facility] <10 01/11/2018   ETH <5 07/26/2015    Metabolic Disorder Labs: Lab Results  Component Value Date   HGBA1C 4.9 01/12/2018   MPG 93.93 01/12/2018   No results found for: PROLACTIN Lab Results  Component Value Date   CHOL 146 01/12/2018   TRIG 59 01/12/2018   HDL 49 01/12/2018   CHOLHDL 3.0 01/12/2018   VLDL 12 01/12/2018   LDLCALC 85 01/12/2018   LDLCALC 63 07/21/2016    Physical Findings: AIMS: Facial and Oral Movements Muscles of Facial Expression: None, normal Lips and Perioral Area: None, normal Jaw: None, normal Tongue: None, normal,Extremity Movements Upper (arms, wrists, hands, fingers): None, normal Lower (legs, knees, ankles, toes): None, normal, Trunk Movements Neck, shoulders, hips: None, normal, Overall Severity Severity of abnormal movements (highest score from questions above): None, normal Incapacitation due to abnormal movements: None, normal Patient's awareness of abnormal movements (rate only patient's report): No Awareness, Dental Status Current problems with teeth and/or dentures?: No Does patient usually wear dentures?: No  CIWA:    COWS:     Musculoskeletal: Strength & Muscle Tone: within normal limits Gait & Station: normal Patient leans: N/A  Psychiatric Specialty Exam: Physical Exam  Nursing note and vitals reviewed. Constitutional: She is oriented to person, place, and time. She appears well-developed and well-nourished.  HENT:  Head: Normocephalic.  Neck: Normal range of motion.  Cardiovascular: Normal rate.  Respiratory: Effort normal.  Musculoskeletal: Normal range of motion.  Neurological: She is alert and oriented to person, place, and time.  Skin: Skin is warm.  Psychiatric: Her speech is normal. Judgment and thought content normal. Her  affect is blunt. She is withdrawn. Cognition and memory are impaired.    Review of Systems  Constitutional: Positive for malaise/fatigue.  HENT: Negative.   Eyes: Negative.   Respiratory: Negative.   Cardiovascular: Negative.   Gastrointestinal: Negative.   Genitourinary: Negative.   Musculoskeletal: Negative.   Skin: Negative.   Neurological: Negative.   Endo/Heme/Allergies: Negative.   Psychiatric/Behavioral:       Self-isolating    Blood pressure 125/74, pulse (!) 104, temperature 98.4 F (36.9 C), temperature source Oral, resp. rate 16, height 5\' 3"  (1.6 m), weight 85.7 kg, last menstrual period 12/19/2017, SpO2 100 %.Body mass index is 33.48 kg/m.  General Appearance: Casual and Fairly Groomed  Eye Contact:  Good  Speech:  Clear and Coherent and Normal Rate  Volume:  Decreased  Mood:  Euthymic  Affect:  Flat  Thought Process:  Coherent and Goal Directed  Orientation:  Full (Time, Place, and Person)  Thought Content:  WDL  Suicidal Thoughts:  No  Homicidal Thoughts:  No  Memory:  Immediate;   Fair Recent;   Fair Remote;   Fair  Judgement:  Fair  Insight:  Lacking  Psychomotor Activity:  Normal  Concentration:  Concentration: Fair and Attention Span: Fair  Recall:  [employer] of Knowledge:  Fair  Language:  Fair  Akathisia:  No  Handed:    AIMS (if indicated):     Assets:  Resilience Social Support  ADL's:  Intact  Cognition:  WNL  Sleep:  Number of Hours: 6.25   Treatment Plan Summary: Daily contact with patient to assess and evaluate symptoms and progress in treatment, Medication management and Plan is to:  - Continue inpatient hospitalization.  - Will continue today8/18/2019plan as below except where it is noted.  Problems addressed:  Bipolar 1 current episode manic with psychotic features: -Continue Zyprexa 5 mg p.o. nightly for mood stability -Continue Zoloft 50 mg p.o. daily for mood stability -Continue agitation protocol which  includes  Zyprexa and Ativan or Geodon and diphenhydramine  PTSD Anxiety -Continue Vistaril 50 mg p.o. every 6 hours as needed for anxiety  Insomnia -Continue trazodone 50 mg p.o. nightly as needed for insomnia  Patient to continue to participate in group counseling session  Considering discharge early this week  Nanine Means, NP 01/16/2018, 10:21 AM

## 2018-01-16 NOTE — Progress Notes (Signed)
Adult Psychoeducational Group Note  Date:  01/16/2018 Time:  10:58 PM  Group Topic/Focus:  Wrap-Up Group:   The focus of this group is to help patients review their daily goal of treatment and discuss progress on daily workbooks.  Participation Level:  Minimal  Participation Quality:  Appropriate  Affect:  Appropriate  Cognitive:  Appropriate  Insight: Appropriate  Engagement in Group:  Engaged  Modes of Intervention:  Socialization and Support  Additional Comments:  Patient attended and participated in group tonight. She reports having a good day. She has been thinking about what she will do upon discharge.  Lita MainsFrancis, Eline Geng [facility] 01/16/2018, 10:58 PM

## 2018-01-16 NOTE — [facility] Group Notes (Signed)
[facility]  Date/Time:  01/16/2018  11:00AM-12:00PM  Type of Therapy and Topic:  Group Therapy:  Music and Mood  Participation Level:  Active   Description of Group: In this process group, members listened to a variety of genres of music and identified that different types of music evoke different responses.  Patients were encouraged to identify music that was soothing for them and music that was energizing for them.  Patients discussed how this knowledge can help with wellness and recovery in various ways including managing depression and anxiety as well as encouraging healthy sleep habits.    Therapeutic Goals: 1. Patients will explore the impact of different varieties of music on mood 2. Patients will verbalize the thoughts they have when listening to different types of music11 3. Patients will identify music that is soothing to them as well as music that is energizing to them 4. Patients will discuss how to use this knowledge to assist in maintaining wellness and recovery 5. Patients will explore the use of music as a coping skill  Summary of Patient Progress:  At the beginning of group, patient expressed that she felt "fine" and at the end of group she had left so could not be asked if the music made any change in her mood.  Her affect was flat throughout group and her responses about emotions invoked by different songs remained flat and uninterested.  Therapeutic Modalities: Solution Focused Brief Therapy Activity   Ambrose MantleMareida Grossman-Orr, LCSW

## 2018-01-17 NOTE — Progress Notes (Signed)
[facility] MD Progress Note  01/17/2018 1:04 PM Kimber Fritts  MRN:  [id number] Subjective:    Tara Yang is a 32 y/o F with history of PTSD, depression, and alcohol use disorder who was admitted from [facility] on IVC placed in the ED after she presented brought in by her sister with increasingly bizarre behaviors, latency of speech, anxiety, worsening depression, and apparent response to internal stimuli. She also has not been getting out of bed and not caring for her ADL's. While in the ED, pt was paranoid and bizarre, speaking about concerns of her family's safety and becoming infected with HIV. She became agitated in the ED, pacing, and intrusive with others. She required IM medications to manage her agitation in the ED. She was placed on IVC due to her disorganized behaviors and mood symptoms. She was medically cleared and then transferred to [facility] for additional treatment and stabilization.She was started on trial of zoloft and zyprexa, and she was monitored on the inpatient psychiatry unit. She has been demonstrating incremental improvement of her presenting symptoms.  Today upon evaluation, pt shares, "I'm doing good. My mind is just able to think better." She denies any specific concerns today. She notes some ongoing mild thought blocking and cognitive slowing, but she has improved insight about her symptoms now compared to time of presentation. She requests to sign in voluntarily from IVC. She is sleeping well. Her appetite is good. She denies other physical complaints. She denies SI/HI/AH/VH. She is tolerating her medications well, and she feels that they have been helping. Pt had visits from her parents over the weekend, she report went well. Pt is in agreement to continue her current treatment regimen without changes. Discussed with patient if she has ongoing stability overnight that we will anticipate her discharge tomorrow as we finalize discharge planning. Pt was in agreement with the above plan.  She had no further questions, comments, or concerns.  Principal Problem: PTSD (post-traumatic stress disorder) Diagnosis:   Patient Active Problem List   Diagnosis Date Noted  . Alcohol use disorder, moderate, dependence (HCC) [F10.20] 07/30/2015  . 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 PTSD (post-traumatic stress disorder) [F43.10] 07/29/2015   Total Time spent with patient: 30 minutes  Past Psychiatric History: see H&P  Past Medical History:  Past Medical History:  Diagnosis Date  . Alcohol abuse   . Anxiety   . Medical history non-contributory   . PTSD (post-traumatic stress disorder)    History reviewed. No pertinent surgical history. Family History:  Family History  Problem Relation Age of Onset  . Alcoholism Other    Family Psychiatric  History: see H&P Social History:  Social History   Substance and Sexual Activity  Alcohol Use Not Currently  . Alcohol/week: 0.0 standard drinks   Comment: unknown as pt not able to recall     Social History   Substance and Sexual Activity  Drug Use No   Comment: unknown as pt is not able to recall    Social History   Socioeconomic History  . Marital status: Single    Spouse name: Not on file  . Number of children: Not on file  . Years of education: Not on file  . Highest education level: Not on file  Occupational History  . Not on file  Social Needs  . Financial resource strain: Not on file  . Food insecurity:    Worry: Not on file    Inability: Not  on file  . Transportation needs:    Medical: Not on file    Non-medical: Not on file  Tobacco Use  . Smoking status: Former Games developermoker  . Smokeless tobacco: Never Used  Substance and Sexual Activity  . Alcohol use: Not Currently    Alcohol/week: 0.0 standard drinks    Comment: unknown as pt not able to recall  . Drug use: No    Comment: unknown as pt is not able to recall  . Sexual activity: Yes    Birth  control/protection: Injection  Lifestyle  . Physical activity:    Days per week: Not on file    Minutes per session: Not on file  . Stress: Not on file  Relationships  . Social connections:    Talks on phone: Not on file    Gets together: Not on file    Attends religious service: Not on file    Active member of club or organization: Not on file    Attends meetings of clubs or organizations: Not on file    Relationship status: Not on file  Other Topics Concern  . Not on file  Social History Narrative  . Not on file   Additional Social History:                         Sleep: Good  Appetite:  Good  Current Medications: Current Facility-Administered Medications  Medication Dose Route Frequency Provider Last Rate Last Dose  . acetaminophen (TYLENOL) tablet 650 mg  650 mg Oral Q6H PRN Truman HaywardStarkes, Takia S, FNP      . alum & mag hydroxide-simeth (MAALOX/MYLANTA) 200-200-20 MG/5ML suspension 30 mL  30 mL Oral Q4H PRN Truman HaywardStarkes, Takia S, FNP      . ziprasidone (GEODON) injection 20 mg  20 mg Intramuscular BID PRN Truman HaywardStarkes, Takia S, FNP   20 mg at 01/12/18 0016   And  . diphenhydrAMINE (BENADRYL) injection 50 mg  50 mg Intramuscular BID PRN Truman HaywardStarkes, Takia S, FNP   50 mg at 01/12/18 0016  . hydrOXYzine (ATARAX/VISTARIL) tablet 50 mg  50 mg Oral Q6H PRN Micheal Likensainville, Shaleena Crusoe T, MD      . OLANZapine zydis (ZYPREXA) disintegrating tablet 5 mg  5 mg Oral Q8H PRN Truman HaywardStarkes, Takia S, FNP       And  . LORazepam (ATIVAN) tablet 1 mg  1 mg Oral Q6H PRN Starkes, Takia S, FNP      . magnesium hydroxide (MILK OF MAGNESIA) suspension 30 mL  30 mL Oral Daily PRN Truman HaywardStarkes, Takia S, FNP      . Melatonin TABS 5 mg  5 mg Oral QHS Truman HaywardStarkes, Takia S, FNP   5 mg at 01/16/18 2143  . OLANZapine (ZYPREXA) tablet 5 mg  5 mg Oral QHS Micheal Likensainville, Shahzain Kiester T, MD   5 mg at 01/16/18 2143  . sertraline (ZOLOFT) tablet 50 mg  50 mg Oral Daily Micheal Likensainville, Renia Mikelson T, MD   50 mg at 01/17/18 0753  . traZODone  (DESYREL) tablet 50 mg  50 mg Oral QHS PRN,MR X 1 Micheal Likensainville, Jocilyn Trego T, MD   50 mg at 01/14/18 2140    Lab Results: No results found for this or any previous visit (from the past 48 hour(s)).  Blood Alcohol level:  Lab Results  Component Value Date   [facility]   ETH <5 07/26/2015    Metabolic Disorder Labs: Lab Results  Component Value Date   HGBA1C 4.9 01/12/2018  MPG 93.93 01/12/2018   No results found for: PROLACTIN Lab Results  Component Value Date   CHOL 146 01/12/2018   TRIG 59 01/12/2018   HDL 49 01/12/2018   CHOLHDL 3.0 01/12/2018   VLDL 12 01/12/2018   LDLCALC 85 01/12/2018   LDLCALC 63 07/21/2016    Physical Findings: AIMS: Facial and Oral Movements Muscles of Facial Expression: None, normal Lips and Perioral Area: None, normal Jaw: None, normal Tongue: None, normal,Extremity Movements Upper (arms, wrists, hands, fingers): None, normal Lower (legs, knees, ankles, toes): None, normal, Trunk Movements Neck, shoulders, hips: None, normal, Overall Severity Severity of abnormal movements (highest score from questions above): None, normal Incapacitation due to abnormal movements: None, normal Patient's awareness of abnormal movements (rate only patient's report): No Awareness, Dental Status Current problems with teeth and/or dentures?: No Does patient usually wear dentures?: No  CIWA:    COWS:     Musculoskeletal: Strength & Muscle Tone: within normal limits Gait & Station: normal Patient leans: N/A  Psychiatric Specialty Exam: Physical Exam  Nursing note and vitals reviewed.   Review of Systems  Constitutional: Negative for chills and fever.  Respiratory: Negative for cough and shortness of breath.   Cardiovascular: Negative for chest pain.  Gastrointestinal: Negative for abdominal pain, heartburn, nausea and vomiting.  Psychiatric/Behavioral: Negative for depression, hallucinations and suicidal ideas. The patient is not nervous/anxious  and does not have insomnia.     Blood pressure 106/65, pulse (!) 114, temperature 98.4 F (36.9 C), temperature source Oral, resp. rate 18, height 5\' 3"  (1.6 m), weight 85.7 kg, last menstrual period 12/19/2017, SpO2 100 %.Body mass index is 33.48 kg/m.  General Appearance: Casual and Fairly Groomed  Eye Contact:  Good  Speech:  Clear and Coherent and Normal Rate  Volume:  Normal  Mood:  Anxious and Euthymic  Affect:  Appropriate and Congruent  Thought Process:  Coherent, Goal Directed and Descriptions of Associations: Intact, thought blocking  Orientation:  Full (Time, Place, and Person)  Thought Content:  Logical  Suicidal Thoughts:  No  Homicidal Thoughts:  No  Memory:  Immediate;   Fair Recent;   Fair Remote;   Fair  Judgement:  Good  Insight:  Good  Psychomotor Activity:  Normal  Concentration:  Concentration: Good  Recall:  Good  Fund of Knowledge:  Good  Language:  Good  Akathisia:  No  Handed:    AIMS (if indicated):     Assets:  Resilience Social Support  ADL's:  Intact  Cognition:  WNL  Sleep:  Number of Hours: 6.25   Treatment Plan Summary: Daily contact with patient to assess and evaluate symptoms and progress in treatment and Medication management   -Continue inpatient hospitalization  -Bipolar I, current episode manic, with psychotic features and PTSD -Continue zoloft 50mg  po qDay -Continue zyprexa 5mg  po qhs  -Anxiety -Continue vistaril 50mg  po q6h prn anxiety  -Insomnia  -Continue melatonin 5mg  po qhs - Continue trazodone 50mg  po qhs prn insomnia (may repeat x1 PRN)  -hypokalemia -Continue Klor-Con CR 10mEq po qDay for total of 3 doses  -agitation - Continue zydis 5mg  po q8h prn agitation -Continue ativan 1mg  po q6h prn agitation -Continue geodon 20mg  IM q12h prn severe agitation -Continue benadryl 50mg  IM q12h prn  severe agitation  -Encourage participation in groups and therapeutic milieu  -Disposition planning will be ongoing  Micheal Likenshristopher T Dejuan Elman, MD 01/17/2018, 1:04 PM

## 2018-01-17 NOTE — Progress Notes (Signed)
Did not attend group 

## 2018-01-17 NOTE — Progress Notes (Signed)
Nursing Progress Note: 7p-7a D: Pt currently presents with a anxious/nervous/guarded affect and behavior. Interacting appropriately with the milieu. Pt reports good sleep during the previous night with current medication regimen. Pt did attend wrap-up group.  A: Pt provided with medications per providers orders. Pt's labs and vitals were monitored throughout the night. Pt supported emotionally and encouraged to express concerns and questions. Pt educated on medications.  R: Pt's safety ensured with 15 minute and environmental checks. Pt currently denies SI, HI, and AVH. Pt verbally contracts to seek staff if SI,HI, or AVH occurs and to consult with staff before acting on any harmful thoughts. Will continue to monitor.

## 2018-01-17 NOTE — Progress Notes (Signed)
D: Pt A & O to time, place and event. Denies SI, HI, AVH and pain when assessed. Forwards on interactions though she's still guarded and isolative to her room. OOB for meals and groups and back to bed; eye contact is brief but fair. Rates her depression, anxiety and hopelessness all 0/10.  Pt's goal this shift "to continue to write down my plan for staying in recovery and for discharge". Reports fair sleep with good appetite, good concentration and normal energy level on self inventory sheet. A: Scheduled medications given as ordered with verbal education and effects monitored. Encouraged pt to voice concerns. Safety checks continues at Q 15 minutes intervals without self harm gestures or outburst.  R: Cooperative with care and unit routines. Compliant with medications when offered. Denies adverse drug reactions when assessed. Safety maintained on and off unit.

## 2018-01-17 NOTE — [facility] Group Notes (Signed)
LCSW Group Therapy Note   01/17/2018 1:15pm   Type of Therapy and Topic:  Group Therapy:  Overcoming Obstacles   Participation Level:  Active   Description of Group:    In this group patients will be encouraged to explore what they see as obstacles to their own wellness and recovery. They will be guided to discuss their thoughts, feelings, and behaviors related to these obstacles. The group will process together ways to cope with barriers, with attention given to specific choices patients can make. Each patient will be challenged to identify changes they are motivated to make in order to overcome their obstacles. This group will be process-oriented, with patients participating in exploration of their own experiences as well as giving and receiving support and challenge from other group members.   Therapeutic Goals: 1. Patient will identify personal and current obstacles as they relate to admission. 2. Patient will identify barriers that currently interfere with their wellness or overcoming obstacles.  3. Patient will identify feelings, thought process and behaviors related to these barriers. 4. Patient will identify two changes they are willing to make to overcome these obstacles:      Summary of Patient Progress  Stayed the entire time, engaged throughout.  Talked openly about her drinking as a problem, and outlined a comprehansive plan for sobriety that she has been working on.    Therapeutic Modalities:   Cognitive Behavioral Therapy Solution Focused Therapy Motivational Interviewing Relapse Prevention Therapy  Tara RogueRodney B Jazzalynn Rhudy, LCSW 01/17/2018 2:34 PM

## 2018-01-17 NOTE — Tx Team (Signed)
Interdisciplinary Treatment and Diagnostic Plan Update  01/17/2018 Time of Session: 2:30 PM  Tara Yang MRN: [id number]  Principal Diagnosis: PTSD (post-traumatic stress disorder)  Secondary Diagnoses: Principal Problem:   PTSD (post-traumatic stress disorder) Active Problems:   MDD (major depressive disorder), single episode, severe with psychosis ([place])   Current Medications:  Current Facility-Administered Medications  Medication Dose Route Frequency Provider Last Rate Last Dose  . acetaminophen (TYLENOL) tablet 650 mg  650 mg Oral Q6H PRN Nanci Pina, FNP      . alum & mag hydroxide-simeth (MAALOX/MYLANTA) 200-200-20 MG/5ML suspension 30 mL  30 mL Oral Q4H PRN Nanci Pina, FNP      . ziprasidone (GEODON) injection 20 mg  20 mg Intramuscular BID PRN Nanci Pina, FNP   20 mg at 01/12/18 0016   And  . diphenhydrAMINE (BENADRYL) injection 50 mg  50 mg Intramuscular BID PRN Nanci Pina, FNP   50 mg at 01/12/18 0016  . hydrOXYzine (ATARAX/VISTARIL) tablet 50 mg  50 mg Oral Q6H PRN Pennelope Bracken, MD      . OLANZapine zydis (ZYPREXA) disintegrating tablet 5 mg  5 mg Oral Q8H PRN Nanci Pina, FNP       And  . LORazepam (ATIVAN) tablet 1 mg  1 mg Oral Q6H PRN Starkes, Takia S, FNP      . magnesium hydroxide (MILK OF MAGNESIA) suspension 30 mL  30 mL Oral Daily PRN Nanci Pina, FNP      . Melatonin TABS 5 mg  5 mg Oral QHS Nanci Pina, FNP   5 mg at 01/16/18 2143  . OLANZapine (ZYPREXA) tablet 5 mg  5 mg Oral QHS Pennelope Bracken, MD   5 mg at 01/16/18 2143  . sertraline (ZOLOFT) tablet 50 mg  50 mg Oral Daily Pennelope Bracken, MD   50 mg at 01/17/18 0753  . traZODone (DESYREL) tablet 50 mg  50 mg Oral QHS PRN,MR X 1 [place], Randa Ngo, MD   50 mg at 01/14/18 2140    PTA Medications: Medications Prior to Admission  Medication Sig Dispense Refill Last Dose  . hydrOXYzine (ATARAX/VISTARIL) 25 MG tablet Take 12.5-50 mg by  mouth every 8 (eight) hours as needed for anxiety (insomnia).    01/10/2018 at Unknown time  . Melatonin CR 3 MG TBCR Take 9 mg by mouth at bedtime.   Past Week at Unknown time    Patient Stressors: Educational concerns Health problems Substance abuse  Patient Strengths: Scientist, research (life sciences) Supportive family/friends  Treatment Modalities: Medication Management, Group therapy, Case management,  1 to 1 session with clinician, Psychoeducation, Recreational therapy.   Physician Treatment Plan for Primary Diagnosis: PTSD (post-traumatic stress disorder) Long Term Goal(s): Improvement in symptoms so as ready for discharge  Short Term Goals: Ability to identify and develop effective coping behaviors will improve Ability to identify triggers associated with substance abuse/mental health issues will improve  Medication Management: Evaluate patient's response, side effects, and tolerance of medication regimen.  Therapeutic Interventions: 1 to 1 sessions, Unit Group sessions and Medication administration.  Evaluation of Outcomes: Adequate for Discharge  Physician Treatment Plan for Secondary Diagnosis: Principal Problem:   PTSD (post-traumatic stress disorder) Active Problems:   MDD (major depressive disorder), single episode, severe with psychosis ([place])   Long Term Goal(s): Improvement in symptoms so as ready for discharge  Short Term Goals: Ability to identify and develop effective coping behaviors will improve Ability to identify triggers associated with substance  abuse/mental health issues will improve  Medication Management: Evaluate patient's response, side effects, and tolerance of medication regimen.  Therapeutic Interventions: 1 to 1 sessions, Unit Group sessions and Medication administration.  Evaluation of Outcomes: Adequate for Discharge   RN Treatment Plan for Primary Diagnosis: PTSD (post-traumatic stress disorder) Long Term Goal(s): Knowledge of disease and therapeutic  regimen to maintain health will improve  Short Term Goals: Ability to identify and develop effective coping behaviors will improve and Compliance with prescribed medications will improve  Medication Management: RN will administer medications as ordered by provider, will assess and evaluate patient's response and provide education to patient for prescribed medication. RN will report any adverse and/or side effects to prescribing provider.  Therapeutic Interventions: 1 on 1 counseling sessions, Psychoeducation, Medication administration, Evaluate responses to treatment, Monitor vital signs and CBGs as ordered, Perform/monitor CIWA, COWS, AIMS and Fall Risk screenings as ordered, Perform wound care treatments as ordered.  Evaluation of Outcomes: Adequate for Discharge   LCSW Treatment Plan for Primary Diagnosis: PTSD (post-traumatic stress disorder) Long Term Goal(s): Safe transition to appropriate next level of care at discharge, Engage patient in therapeutic group addressing interpersonal concerns.  Short Term Goals: Engage patient in aftercare planning with referrals and resources  Therapeutic Interventions: Assess for all discharge needs, 1 to 1 time with Social worker, Explore available resources and support systems, Assess for adequacy in community support network, Educate family and significant other(s) on suicide prevention, Complete Psychosocial Assessment, Interpersonal group therapy.  Evaluation of Outcomes: Met  Return home, follow up outpt   Progress in Treatment: Attending groups: Yes Participating in groups: Yes Taking medication as prescribed: Yes Toleration medication: Yes, no side effects reported at this time Family/Significant other contact made: No Patient understands diagnosis: Yes AEB asking for hep for dealing with trauma Discussing patient identified problems/goals with staff: Yes Medical problems stabilized or resolved: Yes Denies suicidal/homicidal ideation:  Yes Issues/concerns per patient self-inventory: None Other: N/A  New problem(s) identified: None identified at this time.   New Short Term/Long Term Goal(s): "I would like to build self esteem and relax as I deal with trauma."  Discharge Plan or Barriers:   Reason for Continuation of Hospitalization:  Medication stabilization   Estimated Length of Stay: D/C tomorrow  Attendees: Patient:  01/17/2018  2:30 PM  Physician: Maris Berger, MD 01/17/2018  2:30 PM  Nursing: Elesa Massed, RN 01/17/2018  2:30 PM  RN Care Manager: Lars Pinks, RN 01/17/2018  2:30 PM  Social Worker: Ripley Fraise 01/17/2018  2:30 PM  Recreational Therapist: Winfield Cunas 01/17/2018  2:30 PM  Other: Norberto Sorenson 01/17/2018  2:30 PM  Other:  01/17/2018  2:30 PM    Scribe for Treatment Team:  Roque Lias LCSW 01/17/2018 2:30 PM

## 2018-01-17 NOTE — Progress Notes (Signed)
Recreation Therapy Notes  Date: 8.19.19 Time: 1000 Location: 500 [address] Dayroom  Group Topic: Coping Skills  Goal Area(s) Addresses:  Patient will identify positive coping skills. Patient will identify benefits of coping skills. Patient will identify benefits of using coping skills post d/c.  Intervention:  Worksheet  Activity:  Mind map.  Patients were given a mind map.  LRT and patients filled in the first boxes together (misjudging, depression, anxiety, anger, being overwhelmed, loneliness, disturbing thoughts, and not listening).  Patients were to then identify at least three coping skills for each situation before the group reconvened.    Education: PharmacologistCoping Skills, Building control surveyorDischarge Planning.   Education Outcome: Acknowledges understanding/In group clarification offered/Needs additional education.   Clinical Observations/Feedback: Pt did not attend group.    Caroll RancherMarjette Atziri Zubiate, LRT/CTRS     Lillia AbedLindsay, Rayansh Herbst A 01/17/2018 2:07 PM

## 2018-01-18 MED ORDER — MELATONIN 5 MG PO TABS: 5.0000 mg | ORAL_TABLET | Freq: Every day | ORAL | 0 refills | Status: AC

## 2018-01-18 MED ORDER — OLANZAPINE 5 MG PO TABS: 5.0000 mg | ORAL_TABLET | Freq: Every day | ORAL | 0 refills | Status: AC

## 2018-01-18 MED ORDER — SERTRALINE HCL 50 MG PO TABS: 50.0000 mg | ORAL_TABLET | Freq: Every day | ORAL | 0 refills | Status: AC

## 2018-01-18 MED ORDER — HYDROXYZINE HCL 50 MG PO TABS: 50.0000 mg | ORAL_TABLET | Freq: Four times a day (QID) | ORAL | 0 refills | Status: AC | PRN

## 2018-01-18 MED ORDER — TRAZODONE HCL 50 MG PO TABS: 50.0000 mg | ORAL_TABLET | Freq: Every evening | ORAL | 0 refills | Status: AC | PRN

## 2018-01-18 NOTE — Progress Notes (Signed)
Patient ID: [name], female   DOB: 03/03/1986, 32 y.o.   MRN: [id number]  D: Pt reports a good nights, sleep, reports a good appetite, reports a normal energy level, and reports a good concentration level.  Pt denies depression, denies being hopeless, and denies being anxious.  Pt also denies being in any physical pain, and denies SI/HI/AVH.  When reports that what is most important for her for the day is "plan and scheduled my day", and "speak with my family".  Pt is reclusive to her room and has limited interactions with staff and peers.    A: Pt given all meds as scheduled, was maintained on Q15 minute checks through out on the unit, verbalized understanding of her AVS, left unit with all of her belongings and denied SI/HI/AVH prior to leaving hospital.  Pt verbally contracted for safety outside of hospital.

## 2018-01-18 NOTE — Discharge Summary (Addendum)
Physician Discharge Summary Note  Patient:  Tara LandauDaphne Yang is an 32 y.o., female  MRN:  [id number]  DOB:  06/11/1985  Patient phone:  [phone] (home)   Patient address:   [phone] [place] [address],   Total Time spent with patient: Greater than 30 minutes  Date of Admission:  01/11/2018  Date of Discharge: 01/18/2018  Reason for Admission: Increasingly bizarre behaviors, latency of speech, anxiety, worsening depression, and apparent response to internal stimuli.    Principal Problem: PTSD (post-traumatic stress disorder)  Discharge Diagnoses: Patient Active Problem List   Diagnosis Date Noted  . Alcohol use disorder, moderate, dependence (HCC) [F10.20] 07/30/2015  . 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 PTSD (post-traumatic stress disorder) [F43.10] 07/29/2015   Past Psychiatric History:  See above noted  Past Medical History:  Past Medical History:  Diagnosis Date  . Alcohol abuse   . Anxiety   . Medical history non-contributory   . PTSD (post-traumatic stress disorder)    History reviewed. No pertinent surgical history. Family History:  Family History  Problem Relation Age of Onset  . Alcoholism Other    Family Psychiatric  History: See H&P.  Social History:  Social History   Substance and Sexual Activity  Alcohol Use Not Currently  . Alcohol/week: 0.0 standard drinks   Comment: unknown as pt not able to recall     Social History   Substance and Sexual Activity  Drug Use No   Comment: unknown as pt is not able to recall    Social History   Socioeconomic History  . Marital status: Single    Spouse name: Not on file  . Number of children: Not on file  . Years of education: Not on file  . Highest education level: Not on file  Occupational History  . Not on file  Social Needs  . Financial resource strain: Not on file  . Food insecurity:    Worry: Not on file   Inability: Not on file  . Transportation needs:    Medical: Not on file    Non-medical: Not on file  Tobacco Use  . Smoking status: Former Games developermoker  . Smokeless tobacco: Never Used  Substance and Sexual Activity  . Alcohol use: Not Currently    Alcohol/week: 0.0 standard drinks    Comment: unknown as pt not able to recall  . Drug use: No    Comment: unknown as pt is not able to recall  . Sexual activity: Yes    Birth control/protection: Injection  Lifestyle  . Physical activity:    Days per week: Not on file    Minutes per session: Not on file  . Stress: Not on file  Relationships  . Social connections:    Talks on phone: Not on file    Gets together: Not on file    Attends religious service: Not on file    Active member of club or organization: Not on file    Attends meetings of clubs or organizations: Not on file    Relationship status: Not on file  Other Topics Concern  . Not on file  Social History Narrative  . Not on file   Hospital Course: (Per Md's discharge SRA): Tara LandauDaphne Crampton is a 32 y/o F with history of PTSD, depression, and alcohol use disorder who was admitted from [facility] on IVC placed in the ED after she presented brought in by her sister with increasingly  bizarre behaviors, latency of speech, anxiety, worsening depression, and apparent response to internal stimuli. She also has not been getting out of bed and not caring for her ADL's. While in the ED, pt was paranoid and bizarre, speaking about concerns of her family's safety and becoming infected with HIV. She became agitated in the ED, pacing, and intrusive with others. She required IM medications to manage her agitation in the ED. She was placed on IVC due to her disorganized behaviors and mood symptoms. She was medically cleared and then transferred to [facility] for additional treatment and stabilization.She was started on trial of zoloft and zyprexa, and she was monitored on the inpatient psychiatry unit.She has been  reporting improvement of her presenting symptoms.  Besides the use of Zyprexa 5 mg for mood control & Zoloft 50 mg for depression, Keniah was also medicated & discharged on' Vistaril 50 mg prn for anxiety, Melatonin 5 mg for insomnia & Trazodone 50 mg prn for insomnia. He was enrolled & participated in the group counseling sessions being offered & held on this unit. He leaned coping skills.She presented no other significant medical issues that required treatment & monitoring. She tolerated her treatment regimen without any adverse effects or reactions reported.  Today upon her discharge evaluation today, pt shares, "I'm feeling good. I'm ready to go home."She denies any specific concerns today.She is sleeping well. Her appetite is good. She denies other physical complaints. She denies SI/HI/AH/VH. She is tolerating her medications well, and she feels that they have been helping. She is in agreement to continue her current regimen without changes. She was able to engage in safety planning including plan to return to [facility] or contact emergency services if she feels unable to maintain her own safety or the safety of others. Pt had no further questions, comments, or concerns.She is recommended to continue mental health care & medication on outpatient basis as noted below.   Physical Findings: AIMS: Facial and Oral Movements Muscles of Facial Expression: None, normal Lips and Perioral Area: None, normal Jaw: None, normal Tongue: None, normal,Extremity Movements Upper (arms, wrists, hands, fingers): None, normal Lower (legs, knees, ankles, toes): None, normal, Trunk Movements Neck, shoulders, hips: None, normal, Overall Severity Severity of abnormal movements (highest score from questions above): None, normal Incapacitation due to abnormal movements: None, normal Patient's awareness of abnormal movements (rate only patient's report): No Awareness, Dental Status Current problems with teeth and/or  dentures?: No Does patient usually wear dentures?: No  CIWA:    COWS:     Musculoskeletal: Strength & Muscle Tone: within normal limits Gait & Station: normal Patient leans: N/A  Psychiatric Specialty Exam:  SEE MD SRA Review of Systems  Constitutional: Negative.   HENT: Negative.   Eyes: Negative.   Respiratory: Negative.   Cardiovascular: Negative.   Gastrointestinal: Negative.   Genitourinary: Negative.   Musculoskeletal: Negative.   Skin: Negative.   Neurological: Negative.   Endo/Heme/Allergies: Negative.   Psychiatric/Behavioral: Positive for depression (Stable) and hallucinations (Hx. Psychosis). Negative for substance abuse and suicidal ideas. The patient has insomnia (Stable). The patient is not nervous/anxious.   All other systems reviewed and are negative.   Blood pressure 116/68, pulse (!) 106, temperature 98.4 F (36.9 C), temperature source Oral, resp. rate 16, height 5\' 3"  (1.6 m), weight 85.7 kg, last menstrual period 12/19/2017, SpO2 100 %.Body mass index is 33.48 kg/m.     Has this patient used any form of tobacco in the last 30 days? (Cigarettes, Smokeless Tobacco, Cigars,  and/or Pipes): N/A  Blood Alcohol level:  Lab Results  Component Value Date   ETH <10 01/11/2018   ETH <5 07/26/2015   Metabolic Disorder Labs:  Lab Results  Component Value Date   HGBA1C 4.9 01/12/2018   MPG 93.93 01/12/2018   No results found for: PROLACTIN Lab Results  Component Value Date   CHOL 146 01/12/2018   TRIG 59 01/12/2018   HDL 49 01/12/2018   CHOLHDL 3.0 01/12/2018   VLDL 12 01/12/2018   LDLCALC 85 01/12/2018   LDLCALC 63 07/21/2016   See Psychiatric Specialty Exam and Suicide Risk Assessment completed by Attending Physician prior to discharge.  Discharge destination:  Home  Is patient on multiple antipsychotic therapies at discharge:  No   Has Patient had three or more failed trials of antipsychotic monotherapy by history:  No  Recommended Plan for  Multiple Antipsychotic Therapies: NA  Allergies as of 01/18/2018   No Known Allergies     Medication List    STOP taking these medications   Melatonin CR 3 MG Tbcr Replaced by:  Melatonin 5 MG Tabs     TAKE these medications     Indication  hydrOXYzine 50 MG tablet Commonly known as:  ATARAX/VISTARIL Take 1 tablet (50 mg total) by mouth every 6 (six) hours as needed for anxiety. What changed:    medication strength  how much to take  when to take this  reasons to take this  Indication:  Feeling Anxious   Melatonin 5 MG Tabs Take 1 tablet (5 mg total) by mouth at bedtime. For sleep Replaces:  Melatonin CR 3 MG Tbcr  Indication:  Trouble Sleeping   OLANZapine 5 MG tablet Commonly known as:  ZYPREXA Take 1 tablet (5 mg total) by mouth at bedtime. For mood control  Indication:  Mood control   sertraline 50 MG tablet Commonly known as:  ZOLOFT Take 1 tablet (50 mg total) by mouth daily. For depression Start taking on:  01/19/2018  Indication:  Major Depressive Disorder   traZODone 50 MG tablet Commonly known as:  DESYREL Take 1 tablet (50 mg total) by mouth at bedtime as needed for sleep.  Indication:  Trouble Sleeping      Follow-up Information    [facility].   Why:  Tuesday at 2:30 with therapist Myrene BuddyYvonne. Then, Tuesday, Sept 10 at [facility]2PM with Dr Maple HudsonYoung, Psychiatrist. Contact information:  [address] [place], [place], [place] [address] [phone] F: [phone]          Follow-up recommendations: Activity:  As tolerated Diet: As recommended by your primary care doctor. Keep all scheduled follow-up appointments as recommended.    Comments: Patient is instructed prior to discharge to: Take all medications as prescribed by his/her mental healthcare provider. Report any adverse effects and or reactions from the medicines to his/her outpatient provider promptly. Patient has been instructed & cautioned: To not engage in  alcohol and or illegal drug use while on prescription medicines. In the event of worsening symptoms, patient is instructed to call the crisis hotline, 911 and or go to the nearest ED for appropriate evaluation and treatment of symptoms. To follow-up with his/her primary care provider for your other medical issues, concerns and or health care needs.      Signed: Armandina StammerAgnes Nwoko, NP PMHNP, FNP-BC 01/18/2018, 8:50 AM  Patient seen, Suicide Assessment Completed.  Disposition Plan Reviewed

## 2018-01-18 NOTE — Progress Notes (Signed)
  [facility] :  Will you be returning to the same living situation after discharge:  Yes,  home with parents At discharge, do you have transportation home?: Yes,  family Do you have the ability to pay for your medications: Yes,  insurance  Release of information consent forms completed and in the chart;  Patient's signature needed at discharge.  Patient to Follow up at: Follow-up Information    [facility].   Why:  Tuesday at 2:30 with therapist Myrene BuddyYvonne. Then, Tuesday, Sept 10 at [facility] with Dr Maple HudsonYoung, Psychiatrist. Contact information:  [address] [place], [place], [place] [address] [phone] F: [phone]          Next level of care provider has [facility] to [facility] Link:no  Safety Planning and Suicide Prevention discussed: Yes,  yes     Has patient been referred to the Quitline?: N/A patient is not a smoker  Patient has been referred for addiction treatment: Pt. refused referral  Ida RogueRodney B Xavi Tomasik, LCSW 01/18/2018, 12:16 PM

## 2018-01-18 NOTE — [facility] Suicide Risk Assessment (Signed)
[facility]   Principal Problem: PTSD (post-traumatic stress disorder) Discharge Diagnoses:  Patient Active Problem List   Diagnosis Date Noted  . Alcohol use disorder, moderate, dependence (HCC) [F10.20] 07/30/2015  . 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 PTSD (post-traumatic stress disorder) [F43.10] 07/29/2015    Total Time spent with patient: 30 minutes  Musculoskeletal: Strength & Muscle Tone: within normal limits Gait & Station: normal Patient leans: N/A  Psychiatric Specialty Exam: Review of Systems  Constitutional: Negative for chills and fever.  Respiratory: Negative for cough and shortness of breath.   Cardiovascular: Negative for chest pain.  Gastrointestinal: Negative for abdominal pain, heartburn, nausea and vomiting.  Psychiatric/Behavioral: Negative for depression, hallucinations and suicidal ideas. The patient is not nervous/anxious and does not have insomnia.     Blood pressure 116/68, pulse (!) 106, temperature 98.4 F (36.9 C), temperature source Oral, resp. rate 16, height 5\' 3"  (1.6 m), weight 85.7 kg, last menstrual period 12/19/2017, SpO2 100 %.Body mass index is 33.48 kg/m.  General Appearance: Casual and Fairly Groomed  Patent attorneyye Contact::  Good  Speech:  Clear and Coherent and Normal Rate  Volume:  Normal  Mood:  Euthymic  Affect:  Appropriate and Congruent  Thought Process:  Coherent and Goal Directed  Orientation:  Full (Time, Place, and Person)  Thought Content:  Logical and Paranoid Ideation  Suicidal Thoughts:  No  Homicidal Thoughts:  No  Memory:  Immediate;   Fair Recent;   Fair Remote;   Fair  Judgement:  Fair  Insight:  Fair  Psychomotor Activity:  Normal  Concentration:  Fair  Recall:  FiservFair  Fund of Knowledge:Fair  Language: Fair  Akathisia:  No  Handed:    AIMS (if indicated):     Assets:  Resilience Social Support  Sleep:  Number of Hours: 6   Cognition: WNL  ADL's:  Intact   Mental Status Per Nursing Assessment::   On Admission:  NA  Demographic Factors:  Adolescent or young adult, Low socioeconomic status and Unemployed  Loss Factors: Financial problems/change in socioeconomic status  Historical Factors: Impulsivity  Risk Reduction Factors:   Sense of responsibility to family, Positive social support, Positive therapeutic relationship and Positive coping skills or problem solving skills  Continued Clinical Symptoms:  Severe Anxiety and/or Agitation Depression:   Severe  Cognitive Features That Contribute To Risk:  None    Suicide Risk:  Minimal: No identifiable suicidal ideation.  Patients presenting with no risk factors but with morbid ruminations; may be classified as minimal risk based on the severity of the depressive symptoms  Follow-up Information    [facility] on 01/[facility]/2018.   Why:  Tuesday at 2:30 with therapist Tara Yang. Then, Tuesday, Sept 10 at [facility]2PM with Tara Yang, Psychiatrist. Contact information:  [address] [place], [place], [place] [address] [phone] F: [phone]        Subjective Data:  Tara LandauDaphne Yang is a 32 y/o F with history of PTSD, depression, and alcohol use disorder who was admitted from [facility] on IVC placed in the ED after she presented brought in by her sister with increasingly bizarre behaviors, latency of speech, anxiety, worsening depression, and apparent response to internal stimuli. She also has not been getting out of bed and not caring for her ADL's. While in the ED, pt was paranoid and bizarre, speaking about concerns of her family's safety and becoming infected with HIV. She became agitated in  the ED, pacing, and intrusive with others. She required IM medications to manage her agitation in the ED. She was placed on IVC due to her disorganized behaviors and mood symptoms. She was medically cleared and then [facility] and  stabilization.She was started on trial of zoloft and zyprexa, and she was monitored on the inpatient psychiatry unit.She has been reporting improvement of her presenting symptoms.  Today upon evaluation, pt shares, "I'm feeling good. I'm ready to go home."She denies any specific concerns today. She is sleeping well. Her appetite is good. She denies other physical complaints. She denies SI/HI/AH/VH. She is tolerating her medications well, and she feels that they have been helping. She is in agreement to continue her current regimen without changes. She was able to engage in safety planning including [facility] or contact emergency services if she feels unable to maintain her own safety or the safety of others. Pt had no further questions, comments, or concerns.   Plan Of Care/Follow-up recommendations:   -Discharge to outpatient level of care  -Bipolar I, current episode manic, with psychotic features and PTSD -Continue zoloft 50mg  po qDay -Continue zyprexa 5mg  po qhs  -Anxiety -Continue vistaril 50mg  po q6h prn anxiety  -Insomnia  -Continue melatonin 5mg  po qhs - Continue trazodone 50mg  po qhs prn insomnia  Activity:  as tolerated Diet:  normal Tests:  NA Other:  see above for DC plan  Micheal Likenshristopher T Mattingly Fountaine, MD 01/18/2018, 10:20 AM

## 2018-01-18 NOTE — Progress Notes (Signed)
Recreation Therapy Notes  Date: 8.20.19 Time: 1000 Location: 500 Hall Dayroom  Group Topic: Wellness  Goal Area(s) Addresses:  Patient will define components of whole wellness. Patient will verbalize benefit of whole wellness.  Intervention: Music  Activity: Exercise.  LRT lead patients in a series of stretches before starting the exercises.  Once stretched, each patient was given the opportunity to lead the group in an exercise.  The group completed a series of 3 rounds of exercises.  Education: Wellness, Discharge Planning.   Education Outcome: Acknowledges education/In group clarification offered/Needs additional education.   Clinical Observations/Feedback: Pt did not attend group.    Kaho Selle, LRT/CTRS         Tajah Schreiner A 01/18/2018 12:09 PM
